# Patient Record
Sex: Female | Born: 1975 | Race: White | Hispanic: No | Marital: Married | Smoking: Former smoker
Health system: Southern US, Community
[De-identification: ages and names within clinical notes are randomized; demographics above are authoritative.]

## PROBLEM LIST (undated history)

## (undated) DIAGNOSIS — N390 Urinary tract infection, site not specified: Secondary | ICD-10-CM

## (undated) DIAGNOSIS — B019 Varicella without complication: Secondary | ICD-10-CM

## (undated) DIAGNOSIS — E162 Hypoglycemia, unspecified: Secondary | ICD-10-CM

## (undated) DIAGNOSIS — R519 Headache, unspecified: Secondary | ICD-10-CM

## (undated) DIAGNOSIS — R51 Headache: Secondary | ICD-10-CM

## (undated) HISTORY — DX: Varicella without complication: B01.9

## (undated) HISTORY — DX: Headache, unspecified: R51.9

## (undated) HISTORY — DX: Urinary tract infection, site not specified: N39.0

## (undated) HISTORY — DX: Hypoglycemia, unspecified: E16.2

## (undated) HISTORY — DX: Headache: R51

---
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
## 2009-09-11 ENCOUNTER — Other Ambulatory Visit: Admission: RE | Admit: 2009-09-11 | Discharge: 2009-09-11 | Payer: Self-pay | Admitting: Obstetrics and Gynecology

## 2010-11-24 ENCOUNTER — Inpatient Hospital Stay (HOSPITAL_COMMUNITY)
Admission: AD | Admit: 2010-11-24 | Discharge: 2010-11-26 | DRG: 373 | Disposition: A | Discharge status: Home / Self Care | Payer: BC Managed Care – PPO | Attending: Obstetrics and Gynecology | Admitting: Obstetrics and Gynecology

## 2010-11-24 LAB — CBC
Hemoglobin: 13.2 g/dL (ref 12.0–15.0)
Platelets: 144 10*3/uL — ABNORMAL LOW (ref 150–400)
RBC: 4.03 MIL/uL (ref 3.87–5.11)
WBC: 19.8 10*3/uL — ABNORMAL HIGH (ref 4.0–10.5)

## 2010-11-24 LAB — RPR: RPR Ser Ql: NONREACTIVE

## 2010-11-25 LAB — CBC
HCT: 28.6 % — ABNORMAL LOW (ref 36.0–46.0)
Hemoglobin: 9.8 g/dL — ABNORMAL LOW (ref 12.0–15.0)
MCHC: 34.3 g/dL (ref 30.0–36.0)
RBC: 3.01 MIL/uL — ABNORMAL LOW (ref 3.87–5.11)
WBC: 17.8 10*3/uL — ABNORMAL HIGH (ref 4.0–10.5)

## 2011-03-03 ENCOUNTER — Other Ambulatory Visit (HOSPITAL_COMMUNITY)
Admission: RE | Admit: 2011-03-03 | Discharge: 2011-03-03 | Disposition: A | Discharge status: Home / Self Care | Payer: BC Managed Care – PPO | Source: Ambulatory Visit | Attending: Obstetrics and Gynecology | Admitting: Obstetrics and Gynecology

## 2011-03-03 ENCOUNTER — Other Ambulatory Visit: Payer: Self-pay | Admitting: Obstetrics and Gynecology

## 2011-03-03 DIAGNOSIS — Z01419 Encounter for gynecological examination (general) (routine) without abnormal findings: Secondary | ICD-10-CM | POA: Insufficient documentation

## 2011-10-20 ENCOUNTER — Ambulatory Visit (INDEPENDENT_AMBULATORY_CARE_PROVIDER_SITE_OTHER): Payer: 59 | Admitting: Internal Medicine

## 2011-10-20 ENCOUNTER — Encounter: Payer: Self-pay | Admitting: *Deleted

## 2011-10-20 ENCOUNTER — Encounter: Payer: Self-pay | Admitting: Internal Medicine

## 2011-10-20 ENCOUNTER — Telehealth: Payer: Self-pay | Admitting: *Deleted

## 2011-10-20 VITALS — BP 108/62 | HR 78 | Temp 97.8°F | Resp 16 | Ht 67.0 in | Wt 114.0 lb

## 2011-10-20 DIAGNOSIS — Z0000 Encounter for general adult medical examination without abnormal findings: Secondary | ICD-10-CM

## 2011-10-20 DIAGNOSIS — H6980 Other specified disorders of Eustachian tube, unspecified ear: Secondary | ICD-10-CM

## 2011-10-20 NOTE — Patient Instructions (Signed)
Dampened hearing - Eustachian tube dysfunction related to your cold. Plan - sudafed (generic) 30 mg two or three times a day.  Health maintenance - will get old labs. See the doctor every 2 years 364 days. We will always be available for acute needs.  Thanks for coming to see me.  Barotitis Media Barotitis media is soreness (inflammation) of the area behind the eardrum (middle ear). This occurs when the auditory tube (Eustachian tube) leading from the back of the throat to the eardrum is blocked. When it is blocked air cannot move in and out of the middle ear to equalize pressure changes. These pressure changes come from changes in altitude when:  Flying.   Driving in the mountains.   Diving.  Problems are more likely to occur with pressure changes during times when you are congested as from:  Hay fever.   Upper respiratory infection.   A cold.  Damage or hearing loss (barotrauma) caused by this may be permanent. HOME CARE INSTRUCTIONS    Use medicines as recommended by your caregiver. Over the counter medicines will help unblock the canal and can help during times of air travel.   Do not put anything into your ears to clean or unplug them. Eardrops will not be helpful.   Do not swim, dive, or fly until your caregiver says it is all right to do so. If these activities are necessary, chewing gum with frequent swallowing may help. It is also helpful to hold your nose and gently blow to pop your ears for equalizing pressure changes. This forces air into the Eustachian tube.   For little ones with problems, give your baby a bottle of water or juice during periods when pressure changes would be anticipated such as during take offs and landings associated with air travel.   Only take over-the-counter or prescription medicines for pain, discomfort, or fever as directed by your caregiver.   A decongestant may be helpful in de-congesting the middle ear and make pressure equalization easier.  This can be even more effective if the drops (spray) are delivered with the head lying over the edge of a bed with the head tilted toward the ear on the affected side.   If your caregiver has given you a follow-up appointment, it is very important to keep that appointment. Not keeping the appointment could result in a chronic or permanent injury, pain, hearing loss and disability. If there is any problem keeping the appointment, you must call back to this facility for assistance.  SEEK IMMEDIATE MEDICAL CARE IF:    You develop a severe headache, dizziness, severe ear pain, or bloody or pus-like drainage from your ears.   An oral temperature above 102 F (38.9 C) develops.   Your problems do not improve or become worse.  MAKE SURE YOU:    Understand these instructions.   Will watch your condition.   Will get help right away if you are not doing well or get worse.  Document Released: 05/30/2000 Document Revised: 05/22/2011 Document Reviewed: 01/06/2008 [facility] Patient Information 2012 [place], [place].

## 2011-10-20 NOTE — Progress Notes (Signed)
Subjective:    Patient ID: Christine Lawrence, female    DOB: Mar 26, 1976, 36 y.o.   MRN: [id number]  HPI [name] presents to establish for on-going continuity care. She has a minor problem with residual sinus congestion and itchy ear from head cold, otherwise doing well. She is current with her gynecologist/obstetrician.  Past Medical History  Diagnosis Date  . Varicella 1982  . Generalized headaches     no recent headaches  . HIV infection 2008    routine for gyn   . Migraine     no recent migraines.  Marland Kitchen UTI (urinary tract infection)    History reviewed. No pertinent past surgical history. Family History  Problem Relation Age of Onset  . Hyperlipidemia Father   . Hypertension Father   . Heart disease Maternal Grandmother   . Diabetes Maternal Grandmother   . Arthritis Paternal Grandfather   . Cancer Paternal Grandfather     prostate  . Stroke Maternal Grandfather    History   Social History  . Marital Status: Married    Spouse Name: N/A    Number of Children: 1  . Years of Education: 16   Occupational History  . mixologist     [employer]   Social History Main Topics  . Smoking status: Former Smoker -- 0.5 packs/day for 10 years    Types: Cigarettes    Quit date: 04/04/2009  . Smokeless tobacco: Never Used  . Alcohol Use: Yes     occasional wine  . Drug Use: No  . Sexually Active: Yes -- Female partner(s)   Other Topics Concern  . Not on file   Social History Narrative   HSG, [facility] - [place] - [place]. Married '11. I dtr - '12. Work - Merchandiser, retail. Life is good.         Review of Systems Constitutional:  Negative for fever, chills, activity change and unexpected weight change.  HEENT:  Negative for hearing loss, ear pain, congestion, neck stiffness and postnasal drip. Negative for sore throat or swallowing problems. Negative for dental complaints.   Eyes: Negative for vision loss or change in visual acuity.  Respiratory:  Negative for chest tightness and wheezing. Negative for DOE.   Cardiovascular: Negative for chest pain or palpitations. No decreased exercise tolerance Gastrointestinal: No change in bowel habit. No bloating or gas. No reflux or indigestion Genitourinary: Negative for urgency, frequency, flank pain and difficulty urinating.  Musculoskeletal: Negative for myalgias, back pain, arthralgias and gait problem.  Neurological: Negative for dizziness, tremors, weakness and headaches.  Hematological: Negative for adenopathy.  Psychiatric/Behavioral: Negative for behavioral problems and dysphoric mood.       Objective:   Physical Exam Filed Vitals:   10/20/11 1036  BP: 108/62  Pulse: 78  Temp: 97.8 F (36.6 C)  Resp: 16   Weight: 114 lb (51.71 kg)  BMI 17.85 (nl 19-25)  Gen'l: thin white woman in no distress HEENT- EACs clear, no abrasion or irritation noted; TM's normal; oropharynx w/o lesions; posterior pharynx normal; teeth in good repair; no buccal lesions Neck - supple, no thyromegaly Nodes - negative submandibular, cervical, supraclavicular region Chest - w/o deformity, no CVAT Pulm -  Normal respirations, good breath sounds Cor- 2+ radial pulses, 2+ DP pulses. Regular rate and rhythm w/o murmurs Breast exam- deferred Abdomen - scaphoid, no organosplenomegaly, no guarding or rebound, no masses Pelvic - deferred to gyn Ext - no deformity Neuro - A&O x 3 CN II-XII grossly normal, DTR's normal,  MS normal, Cerebellar - no tremor, normal gait and station Derm - minor acne, small 4 mm cutaneous nodule right neck.       Assessment & Plan:  Eustachian Tube dysfunction - recent cold with residual change in hearing.  Plan - OTC sudafed 30 mg two or three times a day.

## 2011-10-20 NOTE — Assessment & Plan Note (Signed)
Very clear past medical history - she has enjoyed good health. Physical exam, sans breast and pelvic, is normal. She reports she has had lab work done in the last 2-3 years. She will obtain copies of her lab results to be reviewed before ordering additional studies. She immigrated in the last five years and had all her required immunizations at that time. She will obtain records of her immunizations if possible.  In summary - a nice young woman who appears to be healthy and medically stable except that she is a bit underweight with a low BMI. She will follow with her Ob/Gyn as scheduled. She should have a general physical exam in 3 years. She is oriented to the services and processes of the practice so that she can be seen on an as needed basis.

## 2011-10-29 NOTE — Telephone Encounter (Signed)
error 

## 2011-10-30 ENCOUNTER — Telehealth: Payer: Self-pay | Admitting: Internal Medicine

## 2011-10-30 NOTE — Telephone Encounter (Signed)
Received copies from [facility] ,on 10/30/11. Forwarded 7 pages to Dr.Norins,for review .

## 2012-03-19 ENCOUNTER — Other Ambulatory Visit (HOSPITAL_COMMUNITY)
Admission: RE | Admit: 2012-03-19 | Discharge: 2012-03-19 | Disposition: A | Discharge status: Home / Self Care | Payer: 59 | Source: Ambulatory Visit | Attending: Obstetrics and Gynecology | Admitting: Obstetrics and Gynecology

## 2012-03-19 ENCOUNTER — Other Ambulatory Visit: Payer: Self-pay | Admitting: Obstetrics and Gynecology

## 2012-03-19 DIAGNOSIS — N760 Acute vaginitis: Secondary | ICD-10-CM | POA: Insufficient documentation

## 2012-03-19 DIAGNOSIS — Z01419 Encounter for gynecological examination (general) (routine) without abnormal findings: Secondary | ICD-10-CM | POA: Insufficient documentation

## 2013-03-21 ENCOUNTER — Other Ambulatory Visit (HOSPITAL_COMMUNITY)
Admission: RE | Admit: 2013-03-21 | Discharge: 2013-03-21 | Disposition: A | Discharge status: Home / Self Care | Payer: 59 | Source: Ambulatory Visit | Attending: Obstetrics and Gynecology | Admitting: Obstetrics and Gynecology

## 2013-03-21 ENCOUNTER — Other Ambulatory Visit: Payer: Self-pay | Admitting: Obstetrics and Gynecology

## 2013-03-21 DIAGNOSIS — Z01419 Encounter for gynecological examination (general) (routine) without abnormal findings: Secondary | ICD-10-CM | POA: Insufficient documentation

## 2013-03-21 DIAGNOSIS — Z1151 Encounter for screening for human papillomavirus (HPV): Secondary | ICD-10-CM | POA: Insufficient documentation

## 2013-10-05 ENCOUNTER — Ambulatory Visit (INDEPENDENT_AMBULATORY_CARE_PROVIDER_SITE_OTHER): Payer: 59 | Admitting: Internal Medicine

## 2013-10-05 ENCOUNTER — Encounter: Payer: Self-pay | Admitting: Internal Medicine

## 2013-10-05 ENCOUNTER — Other Ambulatory Visit (INDEPENDENT_AMBULATORY_CARE_PROVIDER_SITE_OTHER): Payer: 59

## 2013-10-05 VITALS — BP 92/68 | HR 79 | Temp 98.0°F | Wt 118.1 lb

## 2013-10-05 DIAGNOSIS — K625 Hemorrhage of anus and rectum: Secondary | ICD-10-CM

## 2013-10-05 LAB — CBC WITH DIFFERENTIAL/PLATELET
BASOS PCT: 1.3 % (ref 0.0–3.0)
Basophils Absolute: 0.1 10*3/uL (ref 0.0–0.1)
EOS ABS: 0.2 10*3/uL (ref 0.0–0.7)
Eosinophils Relative: 3.8 % (ref 0.0–5.0)
HCT: 39.3 % (ref 36.0–46.0)
Hemoglobin: 13.3 g/dL (ref 12.0–15.0)
LYMPHS PCT: 32 % (ref 12.0–46.0)
Lymphs Abs: 1.9 10*3/uL (ref 0.7–4.0)
MCHC: 33.9 g/dL (ref 30.0–36.0)
MCV: 92.6 fl (ref 78.0–100.0)
Monocytes Absolute: 0.3 10*3/uL (ref 0.1–1.0)
Monocytes Relative: 5.1 % (ref 3.0–12.0)
NEUTROS PCT: 57.8 % (ref 43.0–77.0)
Neutro Abs: 3.4 10*3/uL (ref 1.4–7.7)
PLATELETS: 196 10*3/uL (ref 150.0–400.0)
RBC: 4.25 Mil/uL (ref 3.87–5.11)
RDW: 12.5 % (ref 11.5–14.6)
WBC: 5.8 10*3/uL (ref 4.5–10.5)

## 2013-10-05 LAB — HEPATIC FUNCTION PANEL
ALK PHOS: 33 U/L — AB (ref 39–117)
ALT: 16 U/L (ref 0–35)
AST: 18 U/L (ref 0–37)
Albumin: 4.3 g/dL (ref 3.5–5.2)
BILIRUBIN DIRECT: 0.1 mg/dL (ref 0.0–0.3)
BILIRUBIN TOTAL: 0.9 mg/dL (ref 0.3–1.2)
TOTAL PROTEIN: 7.4 g/dL (ref 6.0–8.3)

## 2013-10-05 LAB — BASIC METABOLIC PANEL
BUN: 12 mg/dL (ref 6–23)
CALCIUM: 9.3 mg/dL (ref 8.4–10.5)
CO2: 28 mEq/L (ref 19–32)
CREATININE: 0.6 mg/dL (ref 0.4–1.2)
Chloride: 106 mEq/L (ref 96–112)
GFR: 129.92 mL/min (ref >60.00)
Glucose, Bld: 85 mg/dL (ref 70–99)
Potassium: 4.9 mEq/L (ref 3.5–5.1)
Sodium: 141 mEq/L (ref 135–145)

## 2013-10-05 LAB — LIPID PANEL
CHOL/HDL RATIO: 2
Cholesterol: 200 mg/dL (ref 0–200)
HDL: 81.7 mg/dL (ref >39.00)
LDL CALC: 111 mg/dL — AB (ref 0–99)
TRIGLYCERIDES: 38 mg/dL (ref 0.0–149.0)
VLDL: 7.6 mg/dL (ref 0.0–40.0)

## 2013-10-05 NOTE — Progress Notes (Signed)
Pre visit review using our clinic review tool, if applicable. No additional management support is needed unless otherwise documented below in the visit note. 

## 2013-10-05 NOTE — Progress Notes (Signed)
   Subjective:    Patient ID: Christine Lawrence, female    DOB: 07/12/1975, 38 y.o.   MRN: [id number]  HPI  Patient is here  Episode of rectal bleeding Onset 1 week ago, occur daily with bowel movements Describes BRB at terminal of bowel movement, not mixed with stool or clotted. Denies cramping, rectal pain, abdominal pain, fever, nausea or vomiting. History of prior rectal bleeding in setting of hemorrhoids, but none since No GI history or family history of colitis  Also reviewed chronic medical issues and interval medical events  Past Medical History  Diagnosis Date  . Varicella 1982  . Generalized headaches     no recent headaches  . Migraine     no recent migraines.  Marland Kitchen. UTI (urinary tract infection)     Review of Systems     Objective:   Physical Exam  BP 92/68  Pulse 79  Temp(Src) 98 F (36.7 C) (Oral)  Wt 118 lb 1.9 oz (53.579 kg)  SpO2 98% Wt Readings from Last 3 Encounters:  10/05/13 118 lb 1.9 oz (53.579 kg)  10/20/11 114 lb (51.71 kg)   Constitutional: She is thin and fit, appears well-developed and well-nourished. No distress.  Neck: Normal range of motion. Neck supple. No JVD present. No thyromegaly present.  Cardiovascular: Normal rate, regular rhythm and normal heart sounds.  No murmur heard. No BLE edema. Pulmonary/Chest: Effort normal and breath sounds normal. No respiratory distress. She has no wheezes.  Abdomen: soft, nontender, nondistended. Bowel sounds present throughout. No mass, no rebound or guarding Rectal: (exam performed by/supervised by Florentina AddisonKatie fields PA-s) normal appearing external, digital exam without tenderness. No appreciable fissure, mass. No stool in vault but blood-tinged mucus, guaiac positive Psychiatric: She has a normal mood and affect. Her behavior is normal. Judgment and thought content normal.   Lab Results  Component Value Date   WBC 17.8* 11/25/2010   HGB 9.8* 11/25/2010   HCT 28.6* 11/25/2010   PLT 128* 11/25/2010    No  results found.     Assessment & Plan:   Episode of hematochezia, painless -resolving past 48 hours but daily episode for approximately one week Blood separate from stool at end of BM, no melena Prior history of hemorrhoids, but not appreciated on exam Check labs today Refer to GI - consider colonoscopy for evaluation of possible inflammatory bowel disease, versus observation. History with low suspicion for infection No medication changes recommended pending clarification of diagnosis Patient education provided

## 2013-10-05 NOTE — Patient Instructions (Addendum)
It was good to see you today.  We have reviewed your prior records including labs and tests today  Test(s) ordered today. Your results will be released to MyChart (or called to you) after review, usually within 72hours after test completion. If any changes need to be made, you will be notified at that same time.  Medications reviewed and updated, no changes recommended at this time.  we'll make referral to gastroenterology for additional evaluation of your bleeding episode. Our office will contact you regarding appointment(s) once made.   Rectal Bleeding Rectal bleeding is when blood passes out of the anus. It is usually a sign that something is wrong. It may not be serious, but it should always be evaluated. Rectal bleeding may present as bright red blood or extremely dark stools. The color may range from dark red or maroon to black (like tar). It is important that the cause of rectal bleeding be identified so treatment can be started and the problem corrected. CAUSES   Hemorrhoids. These are enlarged (dilated) blood vessels or veins in the anal or rectal area.  Fistulas. Theseare abnormal, burrowing channels that usually run from inside the rectum to the skin around the anus. They can bleed.  Anal fissures. This is a tear in the tissue of the anus. Bleeding occurs with bowel movements.  Diverticulosis. This is a condition in which pockets or sacs project from the bowel wall. Occasionally, the sacs can bleed.  Diverticulitis. Thisis an infection involving diverticulosis of the colon.  Proctitis and colitis. These are conditions in which the rectum, colon, or both, can become inflamed and pitted (ulcerated).  Polyps and cancer. Polyps are non-cancerous (benign) growths in the colon that may bleed. Certain types of polyps turn into cancer.  Protrusion of the rectum. Part of the rectum can project from the anus and bleed.  Certain medicines.  Intestinal infections.  Blood vessel  abnormalities. HOME CARE INSTRUCTIONS  Eat a high-fiber diet to keep your stool soft.  Limit activity.  Drink enough fluids to keep your urine clear or pale yellow.  Warm baths may be useful to soothe rectal pain.  Follow up with your caregiver as directed. SEEK IMMEDIATE MEDICAL CARE IF:  You develop increased bleeding.  You have black or dark red stools.  You vomit blood or material that looks like coffee grounds.  You have abdominal pain or tenderness.  You have a fever.  You feel weak, nauseous, or you faint.  You have severe rectal pain or you are unable to have a bowel movement. MAKE SURE YOU:  Understand these instructions.  Will watch your condition.  Will get help right away if you are not doing well or get worse. Document Released: 11/22/2001 Document Revised: 08/25/2011 Document Reviewed: 11/17/2010 [facility] Patient Information 2014 [place], [place].

## 2013-10-05 NOTE — Progress Notes (Signed)
Christine HueMartina F Lawrence [id number]/10/1975 10/05/2013  Chief Complaint  Patient presents with  . Rectal Bleeding    Pt states on last wednesday notice blood in her stool. Haven't seen any since. denies pain or cramping    Subjective  HPI  Patient presents with bright red painless blood at the end of her stool  1 week ago. Sxs occurred daily for five days, and have resolved. When she went to the bathroom last Wednesday, she noticed bright red blood at the end of her stool and 4 clear drips in the toilet afterwards. Last Monday, she noticed a hard stool. Has a history of painful hemorrhoids postpartum in 2012. Patient denies bloody diarrhea, eating anything unusual, going camping, swimming in water parks, eating raw foods, recent travel. Eats a healthy diet (salads, occasional pizza, asian and spicy foods). LMP was a week ago. Denies N/V, constipation, trauma, abdominal pain, body aches, chills, myalgias, fever. Denies history of diverticulosis, diverticulitis, chron's disease, colorectal cancer, anemia, GI bleeding, or PUD.    Past Medical History  Diagnosis Date  . Varicella 1982  . Generalized headaches     no recent headaches  . Migraine     no recent migraines.  Marland Kitchen. UTI (urinary tract infection)     No past surgical history on file.  Family History  Problem Relation Age of Onset  . Hyperlipidemia Father   . Hypertension Father   . Heart disease Maternal Grandmother   . Diabetes Maternal Grandmother   . Arthritis Paternal Grandfather   . Cancer Paternal Grandfather     prostate  . Stroke Maternal Grandfather     History  Substance Use Topics  . Smoking status: Former Smoker -- 0.50 packs/day for 10 years    Types: Cigarettes    Quit date: 04/04/2009  . Smokeless tobacco: Never Used  . Alcohol Use: Yes     Comment: occasional wine    No current outpatient prescriptions on file prior to visit.   No current facility-administered medications on file prior to visit.     Allergies:No  Known Allergies  Review of Systems  Constitutional: Negative for fever, chills, weight loss and malaise/fatigue.  Respiratory: Negative for shortness of breath.   Cardiovascular: Negative for chest pain.  Gastrointestinal: Positive for blood in stool. Negative for nausea, vomiting, abdominal pain, diarrhea, constipation and melena.       Denies hematemesis and bloody diarrhea.  Genitourinary:       No urinary changes.  Musculoskeletal: Negative for myalgias.  Neurological: Negative for weakness.       Objective  Filed Vitals:   10/05/13 1102  BP: 92/68  Pulse: 79  Temp: 98 F (36.7 C)  TempSrc: Oral  Weight: 118 lb 1.9 oz (53.579 kg)  SpO2: 98%    Physical Exam  Constitutional: She appears well-developed and well-nourished. No distress.  Cardiovascular: Normal rate, regular rhythm and normal heart sounds.  Exam reveals no gallop and no friction rub.   No murmur heard. Respiratory: Effort normal and breath sounds normal. She has no wheezes. She has no rales.  GI: Soft. Bowel sounds are normal. There is no tenderness. There is no rebound and no guarding.  Genitourinary: Guaiac positive stool.  Psychiatric: She has a normal mood and affect. Her behavior is normal. Judgment and thought content normal.  DRE: Normal rectal canal with no inflammation, nodules, or abnormalities. No fissures, hemorrhoids. DRE painless.   BP Readings from Last 3 Encounters:  10/05/13 92/68  10/20/11 108/62  Wt Readings from Last 3 Encounters:  10/05/13 118 lb 1.9 oz (53.579 kg)  10/20/11 114 lb (51.71 kg)    Lab Results  Component Value Date   WBC 17.8* 11/25/2010   HGB 9.8* 11/25/2010   HCT 28.6* 11/25/2010   PLT 128* 11/25/2010    No results found.     Assessment and Plan  Hematochezia, reports resolved but w/ Guiac Positive Stool: Refer to GI for consideration of colonosocopy since Guaiac positive stool. Possible Chron's Disease or other IBD given age or possible internal  hemorrhoids that were not palpated on DRE. No treatment at this time due to unknown etiology.    Labs ordered: CBC to r/o infectious process, Hepatic function panel, Lipid panel, and BMP.   Please call if any new sxs or problems arise.   Return if symptoms worsen or fail to improve. Larwance RoteKatie G Sayge Salvato, Student-PA   I have personally reviewed this case with PA student. I also personally examined this patient. I agree with history and findings as documented above. I reviewed, discussed and approve of the assessment and plan as listed above. Newt LukesValerie A Leschber, MD

## 2013-11-08 ENCOUNTER — Encounter: Payer: Self-pay | Admitting: Internal Medicine

## 2014-03-21 ENCOUNTER — Other Ambulatory Visit: Payer: Self-pay | Admitting: Obstetrics and Gynecology

## 2014-03-21 ENCOUNTER — Other Ambulatory Visit (HOSPITAL_COMMUNITY)
Admission: RE | Admit: 2014-03-21 | Discharge: 2014-03-21 | Disposition: A | Discharge status: Home / Self Care | Payer: 59 | Source: Ambulatory Visit | Attending: Obstetrics and Gynecology | Admitting: Obstetrics and Gynecology

## 2014-03-21 DIAGNOSIS — Z01419 Encounter for gynecological examination (general) (routine) without abnormal findings: Secondary | ICD-10-CM | POA: Insufficient documentation

## 2014-03-23 LAB — CYTOLOGY - PAP

## 2015-02-12 ENCOUNTER — Other Ambulatory Visit: Payer: Self-pay

## 2015-02-12 DIAGNOSIS — Z1231 Encounter for screening mammogram for malignant neoplasm of breast: Secondary | ICD-10-CM

## 2015-02-16 ENCOUNTER — Ambulatory Visit
Admission: RE | Admit: 2015-02-16 | Discharge: 2015-02-16 | Disposition: A | Discharge status: Home / Self Care | Payer: 59 | Source: Ambulatory Visit

## 2015-02-16 DIAGNOSIS — Z1231 Encounter for screening mammogram for malignant neoplasm of breast: Secondary | ICD-10-CM

## 2015-02-21 ENCOUNTER — Other Ambulatory Visit: Payer: Self-pay | Admitting: Obstetrics and Gynecology

## 2015-02-21 ENCOUNTER — Other Ambulatory Visit: Payer: Self-pay

## 2015-02-21 DIAGNOSIS — N644 Mastodynia: Secondary | ICD-10-CM

## 2015-03-01 ENCOUNTER — Ambulatory Visit
Admission: RE | Admit: 2015-03-01 | Discharge: 2015-03-01 | Disposition: A | Discharge status: Home / Self Care | Payer: 59 | Source: Ambulatory Visit | Attending: Obstetrics and Gynecology | Admitting: Obstetrics and Gynecology

## 2015-03-01 DIAGNOSIS — N644 Mastodynia: Secondary | ICD-10-CM

## 2015-04-05 ENCOUNTER — Encounter: Payer: Self-pay | Admitting: Internal Medicine

## 2015-04-05 ENCOUNTER — Ambulatory Visit (INDEPENDENT_AMBULATORY_CARE_PROVIDER_SITE_OTHER): Payer: 59 | Admitting: Internal Medicine

## 2015-04-05 VITALS — BP 98/70 | HR 60 | Temp 98.0°F | Resp 16 | Wt 119.0 lb

## 2015-04-05 DIAGNOSIS — J302 Other seasonal allergic rhinitis: Secondary | ICD-10-CM

## 2015-04-05 DIAGNOSIS — J309 Allergic rhinitis, unspecified: Secondary | ICD-10-CM | POA: Insufficient documentation

## 2015-04-05 NOTE — Patient Instructions (Signed)
Allergic Rhinitis Allergic rhinitis is when the mucous membranes in the nose respond to allergens. Allergens are particles in the air that cause your body to have an allergic reaction. This causes you to release allergic antibodies. Through a chain of events, these eventually cause you to release histamine into the blood stream. Although meant to protect the body, it is this release of histamine that causes your discomfort, such as frequent sneezing, congestion, and an itchy, runny nose.  CAUSES Seasonal allergic rhinitis (hay fever) is caused by pollen allergens that may come from grasses, trees, and weeds. Year-round allergic rhinitis (perennial allergic rhinitis) is caused by allergens such as house dust mites, pet dander, and mold spores. SYMPTOMS  Nasal stuffiness (congestion).  Itchy, runny nose with sneezing and tearing of the eyes. DIAGNOSIS Your health care provider can help you determine the allergen or allergens that trigger your symptoms. If you and your health care provider are unable to determine the allergen, skin or blood testing may be used. Your health care provider will diagnose your condition after taking your health history and performing a physical exam. Your health care provider may assess you for other related conditions, such as asthma, pink eye, or an ear infection. TREATMENT Allergic rhinitis does not have a cure, but it can be controlled by:  Medicines that block allergy symptoms. These may include allergy shots, nasal sprays, and oral antihistamines.  Avoiding the allergen. Hay fever may often be treated with antihistamines in pill or nasal spray forms. Antihistamines block the effects of histamine. There are over-the-counter medicines that may help with nasal congestion and swelling around the eyes. Check with your health care provider before taking or giving this medicine. If avoiding the allergen or the medicine prescribed do not work, there are many new medicines  your health care provider can prescribe. Stronger medicine may be used if initial measures are ineffective. Desensitizing injections can be used if medicine and avoidance does not work. Desensitization is when a patient is given ongoing shots until the body becomes less sensitive to the allergen. Make sure you follow up with your health care provider if problems continue. HOME CARE INSTRUCTIONS It is not possible to completely avoid allergens, but you can reduce your symptoms by taking steps to limit your exposure to them. It helps to know exactly what you are allergic to so that you can avoid your specific triggers. SEEK MEDICAL CARE IF:  You have a fever.  You develop a cough that does not stop easily (persistent).  You have shortness of breath.  You start wheezing.  Symptoms interfere with normal daily activities.   This information is not intended to replace advice given to you by your health care provider. Make sure you discuss any questions you have with your health care provider.   Document Released: 02/25/2001 Document Revised: 06/23/2014 Document Reviewed: 02/07/2013 Elsevier Interactive Patient Education 2016 [employer]  

## 2015-04-05 NOTE — Assessment & Plan Note (Signed)
Her symptoms are consistent with seasonal allergies Allegra or  nasal spray such as Flonase daily in the spring and fall which is when she is symptomatic Explained that her ear discomfort is likely related to allergies since it did improve with taking Allegra daily Deferred audiology examination. Her decreased hearing is likely a result of cold and it did occur with cold Symptomatic treatment  Call with any questions or concerns

## 2015-04-05 NOTE — Progress Notes (Signed)
Pre visit review using our clinic review tool, if applicable. No additional management support is needed unless otherwise documented below in the visit note. 

## 2015-04-05 NOTE — Progress Notes (Signed)
Subjective:    Patient ID: Christine Lawrence, female    DOB: December 24, 1975, 39 y.o.   MRN: [id number]  HPI  She is here to establish and for ear pain.   Her ear pain is more of a discomfort.  Two years ago had a bad head cold with lot of congestion.  When that went away the left ear started to bother her.  Her hearing was muffled and the canal started to itch.  It gets worse with changes in seasons and allergy symtpoms.  She wakes up in the middle of the night itching her ear canal.  Her hearing is decreased on the left. She never knew she had allergies and has never been officially diagnosed, but does have allergy-like symptoms. Last year she took Allegra and it helped. She would prefer not to take any medication and wanted to have it evaluated before restarting the Allegra.    Medications and allergies reviewed with patient and updated if appropriate.  Patient Active Problem List   Diagnosis Date Noted  . Routine health maintenance 10/20/2011    Past Medical History  Diagnosis Date  . Varicella 1982  . Generalized headaches     no recent headaches  . Migraine     no recent migraines.  Marland Kitchen UTI (urinary tract infection)     No past surgical history on file.  Social History   Social History  . Marital Status: Married    Spouse Name: N/A  . Number of Children: 1  . Years of Education: 16   Occupational History  . mixologist     [employer]   Social History Main Topics  . Smoking status: Former Smoker -- 0.50 packs/day for 10 years    Types: Cigarettes    Quit date: 04/04/2009  . Smokeless tobacco: Never Used  . Alcohol Use: Yes     Comment: occasional wine  . Drug Use: No  . Sexual Activity:    Partners: Male   Other Topics Concern  . Not on file   Social History Narrative   [facility], [facility] - [place] - [place]. Married '11. I dtr - '12. Work - Merchandiser, retail. Life is good.     Review of Systems  Constitutional: Negative for fever and chills.    HENT: Positive for congestion (worse in am) and hearing loss. Negative for ear pain, sinus pressure, sneezing and sore throat.   Eyes: Positive for itching. Negative for pain, discharge and redness.  Respiratory: Negative for cough, shortness of breath and wheezing.   Cardiovascular: Negative for chest pain and palpitations.  Neurological: Negative for dizziness, light-headedness and headaches.       Objective:   Filed Vitals:   04/05/15 1051  BP: 98/70  Pulse: 60  Temp: 98 F (36.7 C)  Resp: 16   Filed Weights   04/05/15 1051  Weight: 119 lb (53.978 kg)   Body mass index is 18.63 kg/(m^2).   Physical Exam  Constitutional: She appears well-developed and well-nourished.  HENT:  Head: Normocephalic and atraumatic.  Right Ear: External ear normal.  Left Ear: External ear normal.  Nose: Nose normal.  Mouth/Throat: Oropharynx is clear and moist. No oropharyngeal exudate.  Eyes: Conjunctivae are normal.  Neck: Neck supple. No tracheal deviation present. No thyromegaly present.  Pea sized cyst center of neck, mobile non-tender, cyst like  Cardiovascular: Normal rate, regular rhythm and normal heart sounds.   No murmur heard. Pulmonary/Chest: Effort normal and breath sounds normal. No respiratory distress.  She has no wheezes.  Musculoskeletal: She exhibits no edema.  Lymphadenopathy:    She has no cervical adenopathy.  Skin: No rash noted.        Assessment & Plan:   See problem list  Follow-up as needed

## 2015-05-15 ENCOUNTER — Other Ambulatory Visit: Payer: Self-pay | Admitting: Obstetrics and Gynecology

## 2015-05-15 ENCOUNTER — Other Ambulatory Visit (HOSPITAL_COMMUNITY)
Admission: RE | Admit: 2015-05-15 | Discharge: 2015-05-15 | Disposition: A | Discharge status: Home / Self Care | Payer: 59 | Source: Ambulatory Visit | Attending: Obstetrics and Gynecology | Admitting: Obstetrics and Gynecology

## 2015-05-15 DIAGNOSIS — Z01419 Encounter for gynecological examination (general) (routine) without abnormal findings: Secondary | ICD-10-CM | POA: Diagnosis present

## 2015-05-17 LAB — CYTOLOGY - PAP

## 2015-08-31 ENCOUNTER — Telehealth: Payer: Self-pay

## 2015-08-31 NOTE — Telephone Encounter (Signed)
LVM for pt to call back as soon as possible.   RE: Flu Vaccine 2016-2017 

## 2016-05-15 ENCOUNTER — Other Ambulatory Visit (HOSPITAL_COMMUNITY)
Admission: RE | Admit: 2016-05-15 | Discharge: 2016-05-15 | Disposition: A | Discharge status: Home / Self Care | Payer: 59 | Source: Ambulatory Visit | Attending: Obstetrics and Gynecology | Admitting: Obstetrics and Gynecology

## 2016-05-15 ENCOUNTER — Other Ambulatory Visit: Payer: Self-pay | Admitting: Obstetrics and Gynecology

## 2016-05-15 DIAGNOSIS — Z01419 Encounter for gynecological examination (general) (routine) without abnormal findings: Secondary | ICD-10-CM | POA: Diagnosis present

## 2016-05-15 DIAGNOSIS — Z1151 Encounter for screening for human papillomavirus (HPV): Secondary | ICD-10-CM | POA: Insufficient documentation

## 2016-05-19 LAB — CYTOLOGY - PAP
Diagnosis: NEGATIVE
HPV (WINDOPATH): NOT DETECTED

## 2017-05-19 DIAGNOSIS — Z01419 Encounter for gynecological examination (general) (routine) without abnormal findings: Secondary | ICD-10-CM | POA: Diagnosis not present

## 2018-07-20 ENCOUNTER — Telehealth: Payer: Self-pay | Admitting: Internal Medicine

## 2018-07-20 NOTE — Telephone Encounter (Signed)
Ok with me 

## 2018-07-20 NOTE — Telephone Encounter (Signed)
See note, advise on transfer

## 2018-07-20 NOTE — Telephone Encounter (Signed)
Routing to [facility] office.

## 2018-07-20 NOTE — Telephone Encounter (Signed)
Copied from CRM [id number]. Topic: Appointment Scheduling - Transfer of Care >> Jul 20, 2018 12:22 PM Angela Nevin wrote: Pt is requesting to transfer FROM: [facility]-Dr. Lawerance Bach Pt is requesting to transfer TO: [facility]- Dr. Artis Flock Reason for requested transfer: Husband is a patient at [facility], would like to have same office.   Dr.Burns is this okay with you?

## 2018-07-21 NOTE — Telephone Encounter (Signed)
Fine with me

## 2018-07-21 NOTE — Telephone Encounter (Signed)
Please schedule patient for TOC appointment

## 2018-08-05 ENCOUNTER — Encounter: Payer: 59 | Admitting: Family Medicine

## 2018-08-06 ENCOUNTER — Encounter: Payer: 59 | Admitting: Family Medicine

## 2018-08-06 ENCOUNTER — Encounter: Payer: Self-pay | Admitting: Family Medicine

## 2018-08-06 ENCOUNTER — Ambulatory Visit (INDEPENDENT_AMBULATORY_CARE_PROVIDER_SITE_OTHER): Payer: 59 | Admitting: Family Medicine

## 2018-08-06 VITALS — BP 98/70 | HR 67 | Temp 98.2°F | Ht 67.0 in | Wt 116.6 lb

## 2018-08-06 DIAGNOSIS — M79645 Pain in left finger(s): Secondary | ICD-10-CM

## 2018-08-06 DIAGNOSIS — Z0000 Encounter for general adult medical examination without abnormal findings: Secondary | ICD-10-CM

## 2018-08-06 DIAGNOSIS — Z0001 Encounter for general adult medical examination with abnormal findings: Secondary | ICD-10-CM

## 2018-08-06 DIAGNOSIS — R202 Paresthesia of skin: Secondary | ICD-10-CM | POA: Diagnosis not present

## 2018-08-06 DIAGNOSIS — Z23 Encounter for immunization: Secondary | ICD-10-CM

## 2018-08-06 DIAGNOSIS — M79644 Pain in right finger(s): Secondary | ICD-10-CM | POA: Diagnosis not present

## 2018-08-06 DIAGNOSIS — L301 Dyshidrosis [pompholyx]: Secondary | ICD-10-CM | POA: Diagnosis not present

## 2018-08-06 DIAGNOSIS — Z114 Encounter for screening for human immunodeficiency virus [HIV]: Secondary | ICD-10-CM | POA: Diagnosis not present

## 2018-08-06 LAB — COMPREHENSIVE METABOLIC PANEL
ALBUMIN: 5.1 g/dL (ref 3.5–5.2)
ALT: 20 U/L (ref 0–35)
AST: 21 U/L (ref 0–37)
Alkaline Phosphatase: 51 U/L (ref 39–117)
BILIRUBIN TOTAL: 1.6 mg/dL — AB (ref 0.2–1.2)
BUN: 12 mg/dL (ref 6–23)
CO2: 29 mEq/L (ref 19–32)
CREATININE: 0.71 mg/dL (ref 0.40–1.20)
Calcium: 9.9 mg/dL (ref 8.4–10.5)
Chloride: 101 mEq/L (ref 96–112)
GFR: 90.63 mL/min (ref >60.00)
Glucose, Bld: 86 mg/dL (ref 70–99)
Potassium: 4.4 mEq/L (ref 3.5–5.1)
Sodium: 139 mEq/L (ref 135–145)
Total Protein: 7.4 g/dL (ref 6.0–8.3)

## 2018-08-06 LAB — CBC WITH DIFFERENTIAL/PLATELET
BASOS ABS: 0.1 10*3/uL (ref 0.0–0.1)
BASOS PCT: 0.9 % (ref 0.0–3.0)
EOS ABS: 0.1 10*3/uL (ref 0.0–0.7)
Eosinophils Relative: 0.6 % (ref 0.0–5.0)
HEMATOCRIT: 40 % (ref 36.0–46.0)
HEMOGLOBIN: 13.5 g/dL (ref 12.0–15.0)
Lymphocytes Relative: 24.2 % (ref 12.0–46.0)
Lymphs Abs: 1.9 10*3/uL (ref 0.7–4.0)
MCHC: 33.8 g/dL (ref 30.0–36.0)
MCV: 92.1 fl (ref 78.0–100.0)
MONOS PCT: 7.5 % (ref 3.0–12.0)
Monocytes Absolute: 0.6 10*3/uL (ref 0.1–1.0)
NEUTROS ABS: 5.3 10*3/uL (ref 1.4–7.7)
Neutrophils Relative %: 66.8 % (ref 43.0–77.0)
PLATELETS: 235 10*3/uL (ref 150.0–400.0)
RBC: 4.35 Mil/uL (ref 3.87–5.11)
RDW: 12.8 % (ref 11.5–15.5)
WBC: 8 10*3/uL (ref 4.0–10.5)

## 2018-08-06 LAB — VITAMIN B12: VITAMIN B 12: 351 pg/mL (ref 211–911)

## 2018-08-06 LAB — LIPID PANEL
CHOLESTEROL: 240 mg/dL — AB (ref 0–200)
HDL: 90.2 mg/dL (ref >39.00)
LDL Cholesterol: 137 mg/dL — ABNORMAL HIGH (ref 0–99)
NonHDL: 149.8
Total CHOL/HDL Ratio: 3
Triglycerides: 64 mg/dL (ref 0.0–149.0)
VLDL: 12.8 mg/dL (ref 0.0–40.0)

## 2018-08-06 LAB — TSH: TSH: 0.85 u[IU]/mL (ref 0.35–4.50)

## 2018-08-06 LAB — URIC ACID: URIC ACID, SERUM: 4.3 mg/dL (ref 2.4–7.0)

## 2018-08-06 MED ORDER — GABAPENTIN 300 MG PO CAPS: 300.0000 mg | ORAL_CAPSULE | Freq: Every day | ORAL | 3 refills | Status: DC

## 2018-08-06 NOTE — Progress Notes (Signed)
Patient: Christine Lawrence MRN: [id number] DOB: [birth year] PCP: Orland Mustard, MD     Subjective:  Chief Complaint  Patient presents with  . Transitions Of Care  . issues w/hands  . numbness/tingling in arms and legs    HPI: The patient is a 43 y.o. female who presents today for transition of care.   She is here for an annual and to discuss some other issues. She has not been in hospital over the past year. She is healthy and fit. She has one daughter. She is fasting today.   No FH of breast or colon cancer. Paternal grandfather with colon cancer and maternal grandfather with MI.   Pap: 04/2016 Mmg: 2017-normal Tdap: longer than 10 years Hiv: done at home   She has complaints of tingling in her arms and legs. She feels shooting pain and tingling from the knee down it's in the back of her knee and down. She notices it more after a hard day of work. It helps to squeeze her calves. She has the same feeling on her forearms. Deep massages helps. Pickle juice sometimes helps. She doesn't think it's a muscle cramp and it's not numb. She has to move it to make it feel better. She states it has gone on for years. Her father has the same thing. She has no history of neck or back trauma. Her symptoms are 90% at night when she is laying down. It is waves of tingling/shooting through her arms/legs. If she stands up it will go away for a moment. Ibuprofen does help. Does not happen every night, but at least once/week.   Issues with her hands: about 3-4 years ago she started to get bumps on her knuckles. They put her on a bunch of creams and nothing really worked. She thinks it worse with soy food. It looks like a tiny blister and it itches. She scratches it and it spreads. She has seen a dermatologist and they have her the steroid cream.   She also feels like her joints are going wacky. She states for 6 months she can see tiny bubbles that start at the base of her MCP and travel down her finger and  into her joint then they rupture and hurt so bad. 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 She states that you can see the bubbles and they are blue/bruised looking when they pop. Otherwise they are white or clear in color.   Review of Systems  Constitutional: Positive for fatigue.  Eyes: Negative for visual disturbance.  Respiratory: Negative for shortness of breath.   Cardiovascular: Negative for chest pain.  Gastrointestinal: Negative for abdominal pain and nausea.  Musculoskeletal: Positive for back pain and neck pain.  Skin: Positive for rash.  Neurological: Positive for dizziness, tremors and numbness. Negative for weakness and headaches.       Pins and needles sensation in forearms and legs at night time  Psychiatric/Behavioral: Negative for sleep disturbance. The patient is not nervous/anxious.     Allergies Patient has No Known Allergies.  Past Medical History Patient  has a past medical history of Generalized headaches, Migraine, UTI (urinary tract infection), and Varicella (1982).  Surgical History Patient  has no past surgical history on file.  Family History Pateint's family history includes Arthritis in her paternal grandfather; Cancer in her paternal grandfather; Diabetes in her maternal grandmother; Heart disease in her maternal grandmother; Hyperlipidemia in her father; Hypertension in her father; Stroke in her maternal grandfather.  Social History Patient  reports that she quit smoking about 9 years ago. Her smoking use included cigarettes. She has a 5.00 pack-year smoking history. She has never used smokeless tobacco. She reports current alcohol use. She reports that she does not use drugs.    Objective: Vitals:   08/06/18 1047  BP: 98/70  Pulse: 67  Temp: 98.2 F (36.8 C)  TempSrc: Oral  SpO2: 98%  Weight: 116 lb 9.6 oz (52.9 kg)  Height: 5\' 7"  (1.702 m)    Body mass index is 18.26 kg/m.  Physical Exam Vitals signs reviewed.   Constitutional:      Appearance: Normal appearance. She is normal weight.  HENT:     Head: Normocephalic and atraumatic.     Right Ear: Tympanic membrane, ear canal and external ear normal.     Left Ear: Tympanic membrane, ear canal and external ear normal.     Nose: Nose normal.  Neck:     Musculoskeletal: Normal range of motion and neck supple.  Cardiovascular:     Rate and Rhythm: Normal rate and regular rhythm.     Heart sounds: Normal heart sounds.  Pulmonary:     Effort: Pulmonary effort is normal.     Breath sounds: Normal breath sounds.  Abdominal:     General: Abdomen is flat. Bowel sounds are normal.     Palpations: Abdomen is soft.  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     Findings: Rash (mild dyshidrotic eczema on bilateral hands) present.  Neurological:     General: No focal deficit present.     Mental Status: She is alert and oriented to person, place, and time.     Cranial Nerves: No cranial nerve deficit.     Sensory: No sensory deficit.     Motor: No weakness.     Coordination: Coordination normal.     Gait: Gait normal.     Deep Tendon Reflexes: Reflexes normal.  Psychiatric:        Mood and Affect: Mood normal.        Behavior: Behavior normal.    Depression screen [facility] 2/9 08/06/2018  Decreased Interest 0  Down, Depressed, Hopeless 0  PHQ - 2 Score 0        Assessment/plan: 1. Annual physical exam utd on pap smear. Information given for mmg. tdap given today. Due for pap in 04/2019. Routine labs today. She is very thin and healthy. Works at a barn, rides horses. Continue healthy lifestyle did recommend more cardiovascular exercise. F/u for pap in November.  Patient counseling [x]    Nutrition: Stressed importance of moderation in sodium/caffeine intake, saturated fat and cholesterol, caloric balance, sufficient intake of fresh fruits, vegetables, fiber, calcium, iron, and 1 mg of  folate supplement per day (for females capable of pregnancy).  [x]    Stressed the importance of regular exercise.   []    Substance Abuse: Discussed cessation/primary prevention of tobacco, alcohol, or other drug use; driving or other dangerous activities under the influence; availability of treatment for abuse.   [x]    Injury prevention: Discussed safety belts, safety helmets, smoke detector, smoking near bedding or upholstery.   [x]    Sexuality: Discussed sexually transmitted diseases, partner selection, use of condoms, avoidance of unintended pregnancy  and contraceptive alternatives.  [x]    Dental health: Discussed importance of regular tooth brushing, flossing, and dental visits.  [x]   Health maintenance and immunizations reviewed. Please refer to Health maintenance section.    - CBC with Differential/Platelet - Comprehensive metabolic panel - Lipid panel - TSH  2. Tingling in extremities Large differentials. Checking labs, starting trial of gabapentin. If labs normal, will send to neuro for emg testing.  - ANA - B12  3. Pain in finger of both hands Discussed with patient Im really not sure. Will check labs and then send to ortho. 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 Will let ortho xray her as well.   - Rheumatoid factor - Uric acid - Ambulatory referral to Orthopedic Surgery  4. Dyshidrotic eczema Well controlled. Already has steroid cream. Discussed nature of this that it will never be cured. Will wax and wane. Continue steroid cream prn. Handout given on other lifestyle changes she can implement to help this as well.   5. Need for Tdap vaccination  - Tdap vaccine greater than or equal to 7yo IM  6. Encounter for screening for HIV  - HIV Antibody (routine testing w rflx)   Return in about 9 months (around 05/07/2019) for pap smear .   Orland MustardAllison Kanen Mottola, MD [facility]   08/06/2018

## 2018-08-06 NOTE — Patient Instructions (Signed)
Checking lots of labs for feeling in your arms/legs and bumps as well as routine lab work Start gabapentin as needed at night for tingling. If labs normal would send to neurology.   -referral to ortho hand for the painful bumps!    Dyshidrotic Eczema Dyshidrotic eczema (pompholyx) is a type of eczema that causes very itchy (pruritic), fluid-filled blisters (vesicles) to form on the hands and feet. It can affect people of any age, but is more common before the age of 31. There is no cure, but treatment and certain lifestyle changes can help relieve symptoms. What are the causes? The cause of this condition is not known. What increases the risk? You are more likely to develop this condition if:  You wash your hands frequently.  You have a personal history or family history of eczema, allergies, asthma, or hay fever.  You are allergic to metals such as nickel or cobalt.  You work with cement.  You smoke. What are the signs or symptoms? Symptoms of this condition may affect the hands, feet, or both. Symptoms may come and go (recur), and may include:  Severe itching, which may happen before blisters appear.  Blisters. These may form suddenly. ? In the early stages, blisters may form near the fingertips. ? In severe cases, blisters may grow to large blister masses (bullae). ? Blisters resolve in 2-3 weeks without bursting. This is followed by a dry phase in which itching eases.  Pain and swelling.  Cracks or long, narrow openings (fissures) in the skin.  Severe dryness.  Ridges on the nails. How is this diagnosed? This condition may be diagnosed based on:  A physical exam.  Your symptoms.  Your medical history.  Skin scrapings to rule out a fungal infection.  Testing a swab of fluid for bacteria (culture).  Removing and checking a small piece of skin (biopsy) in order to test for infection or to rule out other conditions.  Skin patch tests. These tests involve taking  patches that contain possible allergens and placing them on your back. Your health care provider will wait a few days and then check to see if an allergic reaction occurred. These tests may be done if your health care provider suspects allergic reactions, or to rule out other types of eczema. You may be referred to a health care provider who specializes in the skin (dermatologist) to help diagnose and treat this condition. How is this treated? There is no cure for this condition, but treatment can help relieve symptoms. Depending on how many blisters you have and how severe they are, your health care provider may suggest:  Avoiding allergens, irritants, or triggers that worsen symptoms. This may involve lifestyle changes such as: ? Using different lotions or soaps. ? Avoiding hot weather or places that will cause you to sweat a lot. ? Managing stress with coping techniques such as relaxation and exercise, and asking for help when you need it. ? Diet changes as recommended by your health care provider.  Using a clean, damp towel (cool compress) to relieve symptoms.  Soaking in a bath that contains a type of salt that relieves irritation (aluminum acetate soaks).  Medicine taken by mouth to reduce itching (oral antihistamines).  Medicine applied to the skin to reduce swelling and irritation (topical corticosteroids).  Medicine that reduces the activity of the body's disease-fighting system (immunosuppressants) to treat inflammation. This may be given in severe cases.  Antibiotic medicines to treat bacterial infection.  Light therapy (phototherapy). This  involves shining ultraviolet (UV) light on affected skin in order to reduce itchiness and inflammation. Follow these instructions at home: Bathing and skin care   Wash skin gently. After bathing or washing your hands, pat your skin dry. Avoid rubbing your skin.  Remove all jewelry before bathing. If the skin under the jewelry stays wet,  blisters may form or get worse.  Apply cool compresses as told by your health care provider: ? Soak a clean towel in cool water. ? Wring out excess water until towel is damp. ? Place the towel over affected skin. Leave the towel on for 20 minutes at a time, 2-3 times a day.  Use mild soaps, cleansers, and lotions that do not contain dyes, perfumes, or other irritants.  Keep your skin hydrated. To do this: ? Avoid very hot water. Take lukewarm baths or showers. ? Apply moisturizer within three minutes of bathing. This locks in moisture. Medicines  Take and apply over-the-counter and prescription medicines only as told by your health care provider.  If you were prescribed antibiotic medicine, take or apply it as told by your health care provider. Do not stop using the antibiotic even if you start to feel better. General instructions  Identify and avoid triggers and allergens.  Keep fingernails short to avoid breaking open the skin while scratching.  Use waterproof gloves to protect your hands when doing work that keeps your hands wet for a long time.  Wear socks to keep your feet dry.  Do not use any products that contain nicotine or tobacco, such as cigarettes and e-cigarettes. If you need help quitting, ask your health care provider.  Keep all follow-up visits as told by your health care provider. This is important. Contact a health care provider if:  You have symptoms that do not go away.  You have signs of infection, such as: ? Crusting, pus, or a bad smell. ? More redness, swelling, or pain. ? Increased warmth in the affected area. Summary  Dyshidrotic eczema (pompholyx) is a type of eczema that causes very itchy (pruritic), fluid-filled blisters (vesicles) to form on the hands and feet.  The cause of this condition is not known.  There is no cure for this condition, but treatment can help relieve symptoms. Treatment depends on how many blisters you have and how severe  they are.  Use mild soaps, cleansers, and lotions that do not contain dyes, perfumes, or other irritants. Keep your skin hydrated. This information is not intended to replace advice given to you by your health care provider. Make sure you discuss any questions you have with your health care provider. Document Released: 10/16/2016 Document Revised: 10/16/2016 Document Reviewed: 10/16/2016 Elsevier Interactive Patient Education  2019 [employer].

## 2018-08-09 LAB — RHEUMATOID FACTOR: Rhuematoid fact SerPl-aCnc: <14 IU/mL (ref <14)

## 2018-08-09 LAB — HIV ANTIBODY (ROUTINE TESTING W REFLEX): HIV: NONREACTIVE

## 2018-08-09 LAB — ANA: ANA: NEGATIVE

## 2018-08-16 ENCOUNTER — Encounter: Payer: Self-pay | Admitting: Family Medicine

## 2018-08-18 DIAGNOSIS — M79641 Pain in right hand: Secondary | ICD-10-CM | POA: Insufficient documentation

## 2018-08-18 DIAGNOSIS — M79642 Pain in left hand: Secondary | ICD-10-CM | POA: Insufficient documentation

## 2019-03-21 ENCOUNTER — Other Ambulatory Visit: Payer: Self-pay

## 2019-03-21 DIAGNOSIS — Z20822 Contact with and (suspected) exposure to covid-19: Secondary | ICD-10-CM

## 2019-03-22 LAB — NOVEL CORONAVIRUS, NAA: SARS-CoV-2, NAA: NOT DETECTED

## 2019-04-18 ENCOUNTER — Other Ambulatory Visit: Payer: Self-pay | Admitting: Obstetrics and Gynecology

## 2019-04-18 ENCOUNTER — Other Ambulatory Visit (HOSPITAL_COMMUNITY)
Admission: RE | Admit: 2019-04-18 | Discharge: 2019-04-18 | Disposition: A | Discharge status: Home / Self Care | Payer: 59 | Source: Ambulatory Visit | Attending: Obstetrics and Gynecology | Admitting: Obstetrics and Gynecology

## 2019-04-18 DIAGNOSIS — Z01419 Encounter for gynecological examination (general) (routine) without abnormal findings: Secondary | ICD-10-CM | POA: Insufficient documentation

## 2019-04-20 LAB — CYTOLOGY - PAP
Comment: NEGATIVE
Diagnosis: NEGATIVE
High risk HPV: NEGATIVE

## 2019-04-22 ENCOUNTER — Other Ambulatory Visit: Payer: Self-pay | Admitting: Obstetrics and Gynecology

## 2019-04-22 DIAGNOSIS — Z1231 Encounter for screening mammogram for malignant neoplasm of breast: Secondary | ICD-10-CM

## 2019-06-15 ENCOUNTER — Other Ambulatory Visit: Payer: Self-pay

## 2019-06-15 ENCOUNTER — Ambulatory Visit
Admission: RE | Admit: 2019-06-15 | Discharge: 2019-06-15 | Disposition: A | Discharge status: Home / Self Care | Payer: 59 | Source: Ambulatory Visit | Attending: Obstetrics and Gynecology | Admitting: Obstetrics and Gynecology

## 2019-06-15 DIAGNOSIS — Z1231 Encounter for screening mammogram for malignant neoplasm of breast: Secondary | ICD-10-CM

## 2019-10-03 ENCOUNTER — Other Ambulatory Visit: Payer: Self-pay | Admitting: Family Medicine

## 2020-06-22 IMAGING — MG DIGITAL SCREENING BILAT W/ TOMO W/ CAD
8 series · 9 of 24 positions shown · non-contrast
Comparison: Previous exam(s).

CLINICAL DATA: Screening.

EXAM:
DIGITAL SCREENING BILATERAL MAMMOGRAM WITH TOMO AND CAD

[R MLO synth-2D]
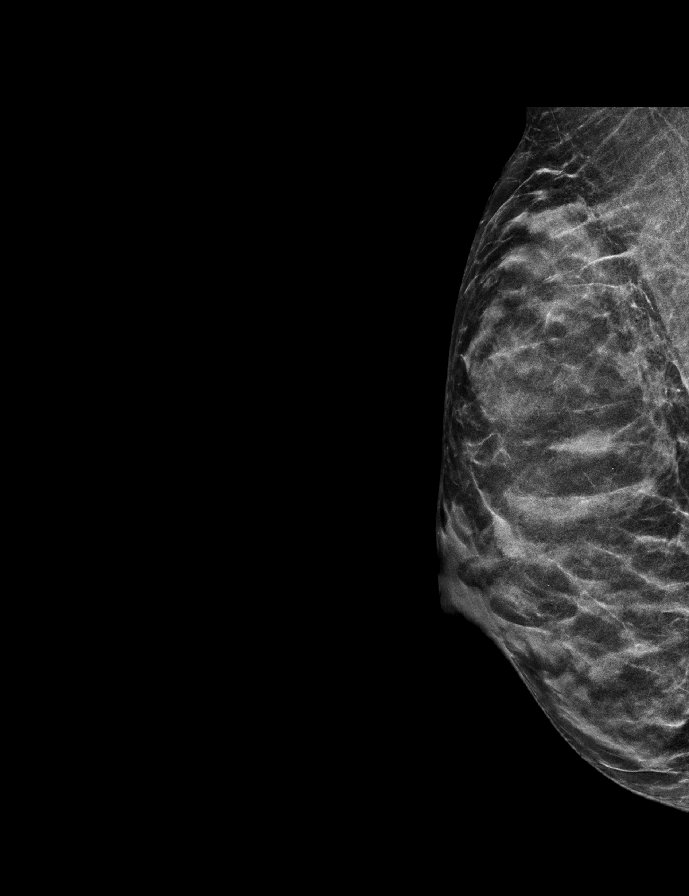

[R CC synth-2D]
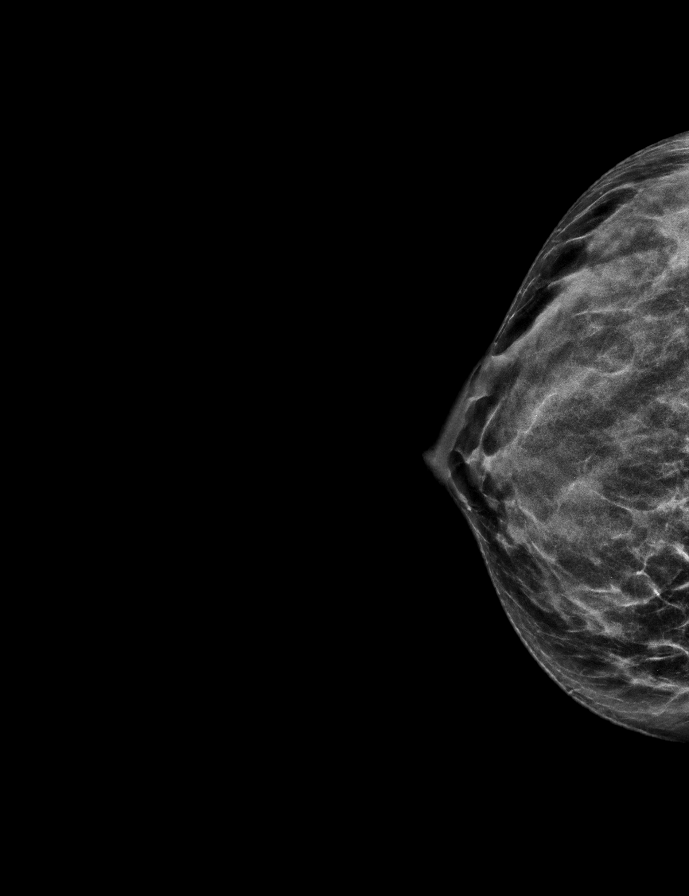

[L MLO synth-2D]
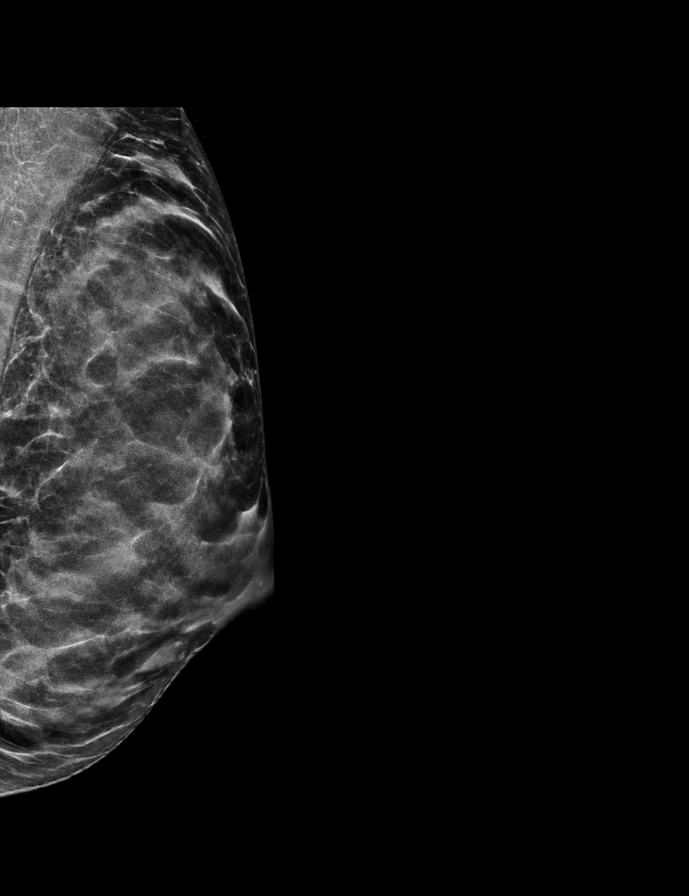

[L CC synth-2D]
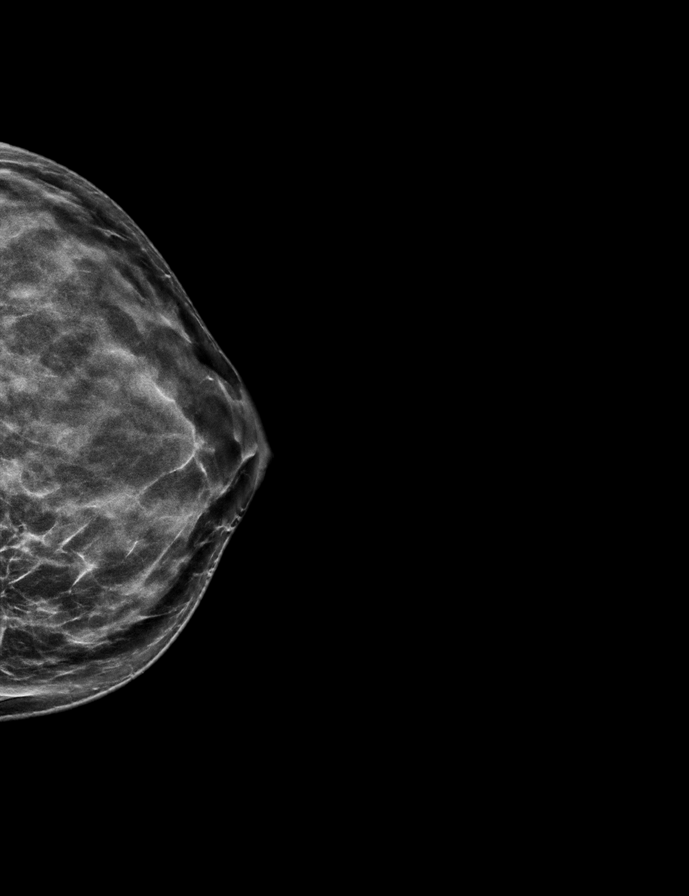

[R CC tomo · 2 of 47 frames shown]
[frame 16/47]
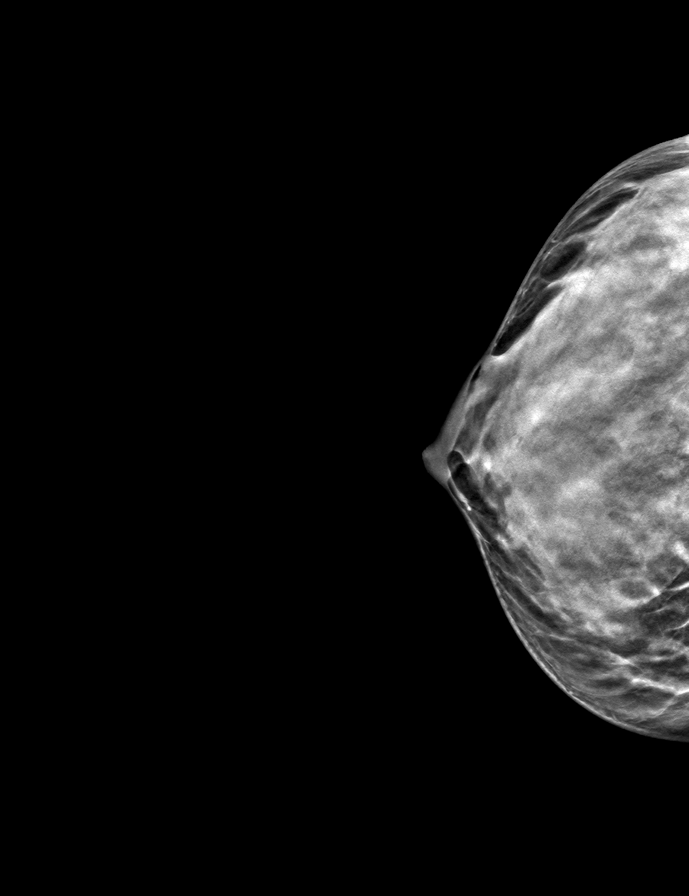
[frame 24/47]
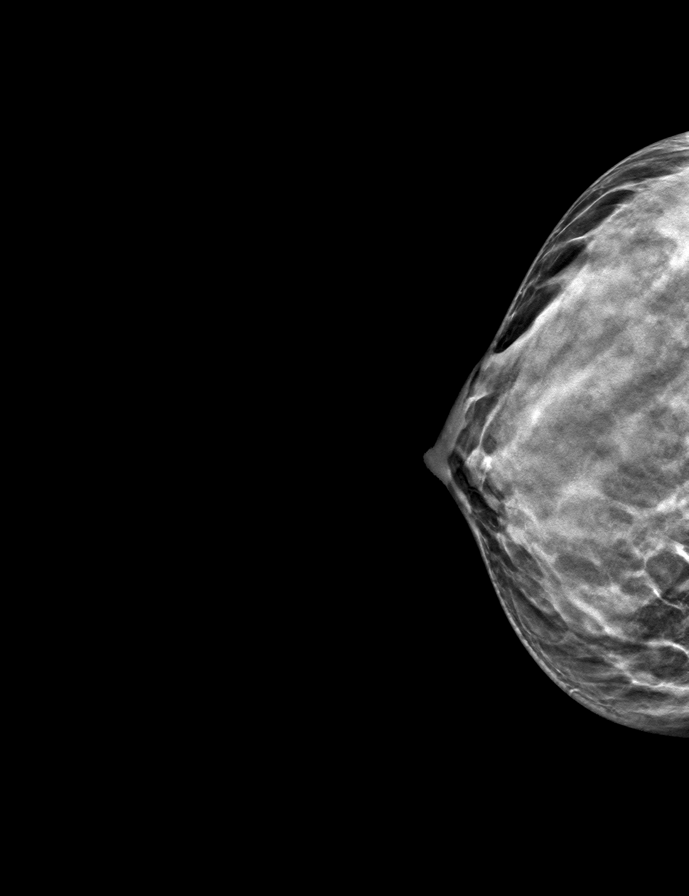

[R MLO tomo · tomo slice 23/44.0]
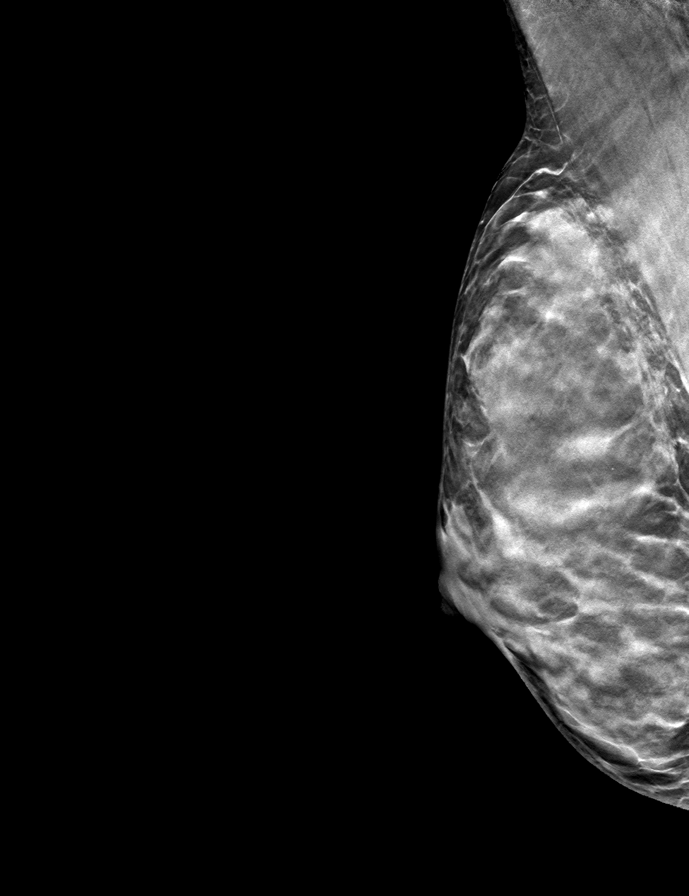

[L CC tomo · tomo slice 28/55.0]
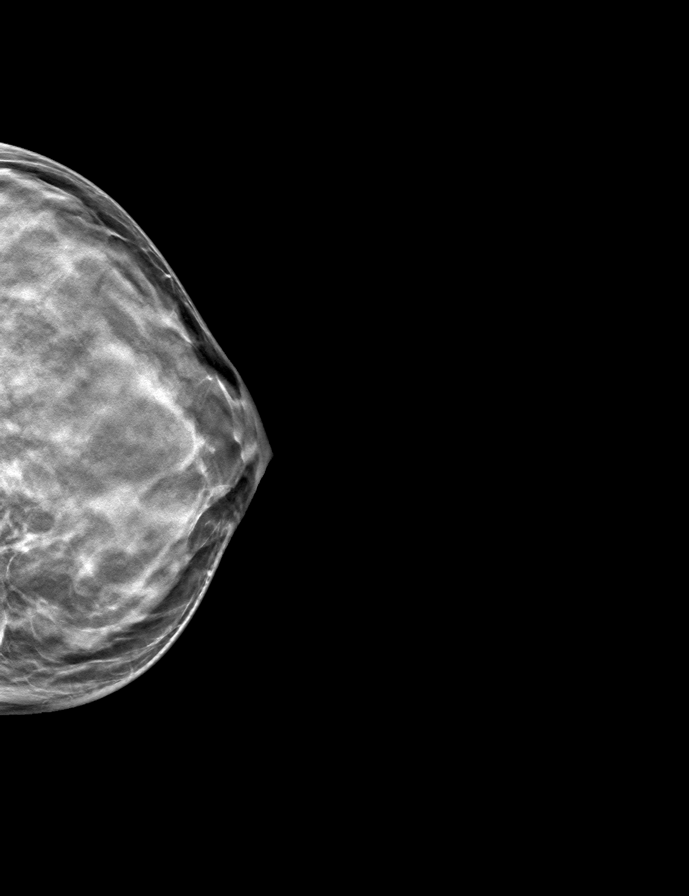

[L MLO tomo · tomo slice 25/48.0]
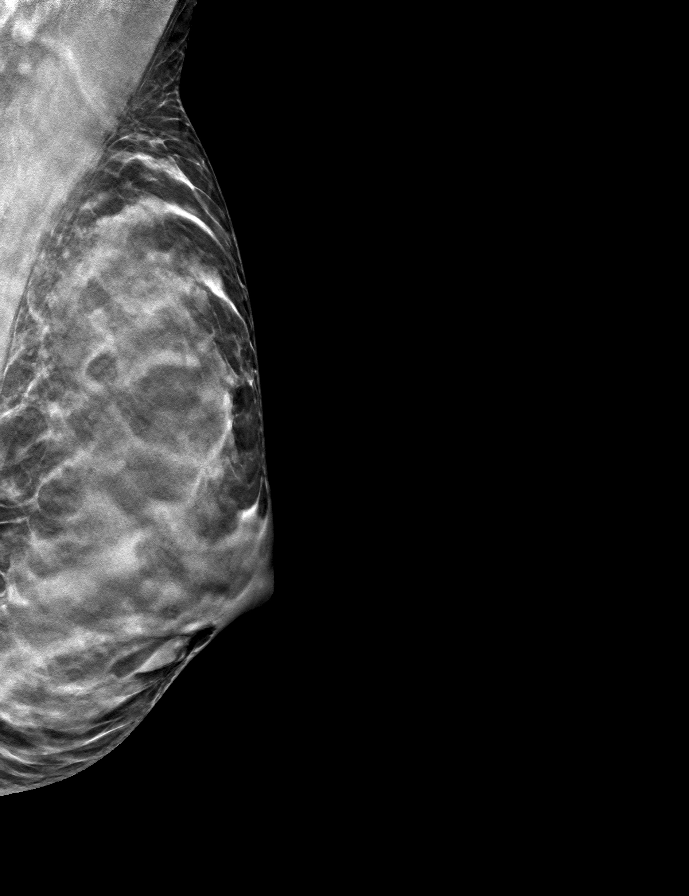

[9 of 24 positions shown; findings below may reference images not displayed]

ACR Breast Density Category d: The breast tissue is extremely dense,
which lowers the sensitivity of mammography
FINDINGS: There are no findings suspicious for malignancy. Images were
processed with CAD.
IMPRESSION: No mammographic evidence of malignancy. A result letter of this
screening mammogram will be mailed directly to the patient.

RECOMMENDATION:
Screening mammogram in one year. (Code:WO-[id number])

BI-RADS CATEGORY  1: Negative.

## 2020-07-06 DIAGNOSIS — Z20822 Contact with and (suspected) exposure to covid-19: Secondary | ICD-10-CM | POA: Diagnosis not present

## 2020-11-26 ENCOUNTER — Ambulatory Visit: Payer: 59 | Admitting: Family Medicine

## 2020-12-05 ENCOUNTER — Other Ambulatory Visit: Payer: Self-pay

## 2020-12-05 ENCOUNTER — Ambulatory Visit (INDEPENDENT_AMBULATORY_CARE_PROVIDER_SITE_OTHER): Payer: 59 | Admitting: Family Medicine

## 2020-12-05 ENCOUNTER — Encounter: Payer: Self-pay | Admitting: Family Medicine

## 2020-12-05 VITALS — BP 108/71 | HR 59 | Temp 98.4°F | Ht 67.0 in | Wt 119.0 lb

## 2020-12-05 DIAGNOSIS — I951 Orthostatic hypotension: Secondary | ICD-10-CM

## 2020-12-05 DIAGNOSIS — F4329 Adjustment disorder with other symptoms: Secondary | ICD-10-CM

## 2020-12-05 DIAGNOSIS — R69 Illness, unspecified: Secondary | ICD-10-CM | POA: Diagnosis not present

## 2020-12-05 LAB — TSH: TSH: 1.04 u[IU]/mL (ref 0.35–4.50)

## 2020-12-05 LAB — COMPREHENSIVE METABOLIC PANEL
ALT: 13 U/L (ref 0–35)
AST: 15 U/L (ref 0–37)
Albumin: 4.5 g/dL (ref 3.5–5.2)
Alkaline Phosphatase: 49 U/L (ref 39–117)
BUN: 17 mg/dL (ref 6–23)
CO2: 28 mEq/L (ref 19–32)
Calcium: 9.2 mg/dL (ref 8.4–10.5)
Chloride: 104 mEq/L (ref 96–112)
Creatinine, Ser: 0.68 mg/dL (ref 0.40–1.20)
GFR: 106.58 mL/min (ref >60.00)
Glucose, Bld: 79 mg/dL (ref 70–99)
Potassium: 4 mEq/L (ref 3.5–5.1)
Sodium: 139 mEq/L (ref 135–145)
Total Bilirubin: 0.7 mg/dL (ref 0.2–1.2)
Total Protein: 7 g/dL (ref 6.0–8.3)

## 2020-12-05 LAB — URINALYSIS, ROUTINE W REFLEX MICROSCOPIC
Bilirubin Urine: NEGATIVE
Hgb urine dipstick: NEGATIVE
Ketones, ur: NEGATIVE
Nitrite: NEGATIVE
RBC / HPF: NONE SEEN (ref 0–?)
Specific Gravity, Urine: 1.015 (ref 1.000–1.030)
Total Protein, Urine: NEGATIVE
Urine Glucose: NEGATIVE
Urobilinogen, UA: 0.2 (ref 0.0–1.0)
pH: 7 (ref 5.0–8.0)

## 2020-12-05 LAB — CBC
HCT: 38.2 % (ref 36.0–46.0)
Hemoglobin: 12.9 g/dL (ref 12.0–15.0)
MCHC: 33.8 g/dL (ref 30.0–36.0)
MCV: 92.5 fl (ref 78.0–100.0)
Platelets: 180 10*3/uL (ref 150.0–400.0)
RBC: 4.13 Mil/uL (ref 3.87–5.11)
RDW: 12.6 % (ref 11.5–15.5)
WBC: 6 10*3/uL (ref 4.0–10.5)

## 2020-12-05 LAB — HEMOGLOBIN A1C: Hgb A1c MFr Bld: 5.5 % (ref 4.6–6.5)

## 2020-12-05 NOTE — Progress Notes (Signed)
   Christine Lawrence is a 45 y.o. female who presents today for an office visit.  Assessment/Plan:  Dizziness/Syncope No current symptoms though this is a uncontrolled chronic issue.  Seems to be mostly positional in nature and has orthostatic component.  She did not have significant drop in blood pressure today though did have a 20 point rise in heart rate on her orthostatic vitals.  Also experienced dizziness while orthostatic vitals were being checked.  She may have POTS and is probably not adequately hydrated.  We will check labs today including UA, CBC, c-Met, TSH, and A1c.  Due to chronicity of symptoms and numerous syncopal episodes in the past will place referral to cardiology for further evaluation.  Encouraged good oral hydration and stressed importance of eating at regular intervals.  Discussed reasons for return to care and seek emergent care.    Subjective:  HPI: She complains of dizziness, and has concerns regarding the possibility of a genetic heart condition, due to her father and grandfather dying due to heart complications. She admits to episodes of syncope, and can tell when the episodes will occur in advance. The most recent episodes she notes were in the time period close to her father's death and funeral. She attributes the syncope episodes to stress, and missing meals. She notes that if she bends over enough to put her head below the height where her heart is when standing, that she will experience syncope. Additionally she notes her hands shaking which is alleviated by eating, but has trouble making sure that she eats, and drinks enough water. 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  She has had syncopal episodes intermittently for years and always knows when it is going to happen. She can precipitate the feelings of dizziness/presyncope by putting her head below her heart and then rising quickly. No chest pain or shortness of breath.            Objective:  Physical Exam: BP 108/71   Pulse (!) 59   Temp 98.4 F (36.9 C) (Temporal)   Ht _0  (1.702 m)   Wt 119 lb (54 kg)   LMP 11/02/2020   SpO2 100%   BMI 18.64 kg/m   Orthostatic VS for the past 72 hrs (Last 3 readings):  Orthostatic BP Patient Position Orthostatic Pulse  12/05/20 1135 117/75 Standing 75  12/05/20 1134 99/65 Sitting 56  12/05/20 1133 110/66 Supine 55   Gen: No acute distress, resting comfortably CV: Regular rate and rhythm with no murmurs appreciated Pulm: Normal work of breathing, clear to auscultation bilaterally with no crackles, wheezes, or rhonchi Neuro: Grossly normal, moves all extremities Psych: Normal affect and thought content       I,Jordan Kelly,acting as a scribe for Dimas Chyle, MD.,have documented all relevant documentation on the behalf of Dimas Chyle, MD,as directed by  Dimas Chyle, MD while in the presence of Dimas Chyle, MD.  I, Dimas Chyle, MD, have reviewed all documentation for this visit. The documentation on 12/05/20 for the exam, diagnosis, procedures, and orders are all accurate and complete.   Algis Greenhouse. Jerline Pain, MD 12/05/2020 11:50 AM

## 2020-12-05 NOTE — Patient Instructions (Signed)
It was very nice to see you today!  We will check blood work and urine sample today.  Due to how long her symptoms have been going on I will place a referral to the cardiologist for further evaluation.  Please let us know if your symptoms worsen or change.  Take care, Dr Jimmey Ralph  PLEASE NOTE:  If you had any lab tests please let us know if you have not heard back within a few days. You may see your results on mychart before we have a chance to review them but we will give you a call once they are reviewed by [place]. If we ordered any referrals today, please let us know if you have not heard from their office within the next week.   Please try these tips to maintain a healthy lifestyle:  Eat at least 3 REAL meals and 1-2 snacks per day.  Aim for no more than 5 hours between eating.  If you eat breakfast, please do so within one hour of getting up.   Each meal should contain half fruits/vegetables, one quarter protein, and one quarter carbs (no bigger than a computer mouse)  Cut down on sweet beverages. This includes juice, soda, and sweet tea.   Drink at least 1 glass of water with each meal and aim for at least 8 glasses per day  Exercise at least 150 minutes every week.    Orthostatic Hypotension Blood pressure is a measurement of how strongly, or weakly, your blood is pressing against the walls of your arteries. Orthostatic hypotension is a sudden drop in blood pressure that happens when you quickly change positions,such as when you get up from sitting or lying down. Arteries are blood vessels that carry blood from your heart throughout your body. When blood pressure is too low, you may not get enough blood to your brain or to the rest of your organs. This can cause weakness, light-headedness, rapid heartbeat, and fainting. This can last for just a few seconds or for up to a few minutes. Orthostatic hypotension is usually not a serious problem. However, if it happens frequently or gets worse,  it may be a sign of somethingmore serious. What are the causes? This condition may be caused by: Sudden changes in posture, such as standing up quickly after you have been sitting or lying down. Blood loss. Loss of body fluids (dehydration). Heart problems. Hormone (endocrine) problems. Pregnancy. Severe infection. Lack of certain nutrients. Severe allergic reactions (anaphylaxis). Certain medicines, such as blood pressure medicine or medicines that make the body lose excess fluids (diuretics). Sometimes, this condition can be caused by not taking medicine as directed, such as taking too much of a certain medicine. What increases the risk? The following factors may make you more likely to develop this condition: Age. Risk increases as you get older. Conditions that affect the heart or the central nervous system. Taking certain medicines, such as blood pressure medicine or diuretics. Being pregnant. What are the signs or symptoms? Symptoms of this condition may include: Weakness. Light-headedness. Dizziness. Blurred vision. Fatigue. Rapid heartbeat. Fainting, in severe cases. How is this diagnosed? This condition is diagnosed based on: Your medical history. Your symptoms. Your blood pressure measurement. Your health care provider will check your blood pressure when you are: Lying down. Sitting. Standing. A blood pressure reading is recorded as two numbers, such as "120 over 80" (or 120/80). The first ("top") number is called the systolic pressure. It is a measure of the pressure in  your arteries as your heart beats. The second ("bottom") number is called the diastolic pressure. It is a measure of the pressure in your arteries when your heart relaxes between beats. Blood pressure is measured in a unit called mm Hg. Healthy blood pressure for most adults is 120/80. If your blood pressure is below 90/60, you may be diagnosed withhypotension. Other information or tests that may be  used to diagnose orthostatic hypotension include: Your other vital signs, such as your heart rate and temperature. Blood tests. Tilt table test. For this test, you will be safely secured to a table that moves you from a lying position to an upright position. Your heart rhythm and blood pressure will be monitored during the test. How is this treated? This condition may be treated by: Changing your diet. This may involve eating more salt (sodium) or drinking more water. Taking medicines to raise your blood pressure. Changing the dosage of certain medicines you are taking that might be lowering your blood pressure. Wearing compression stockings. These stockings help to prevent blood clots and reduce swelling in your legs. In some cases, you may need to go to the hospital for: Fluid replacement. This means you will receive fluids through an IV. Blood replacement. This means you will receive donated blood through an IV (transfusion). Treating an infection or heart problems, if this applies. Monitoring. You may need to be monitored while medicines that you are taking wear off. Follow these instructions at home: Eating and drinking  Drink enough fluid to keep your urine pale yellow. Eat a healthy diet, and follow instructions from your health care provider about eating or drinking restrictions. A healthy diet includes: Fresh fruits and vegetables. Whole grains. Lean meats. Low-fat dairy products. Eat extra salt only as directed. Do not add extra salt to your diet unless your health care provider told you to do that. Eat frequent, small meals. Avoid standing up suddenly after eating.  Medicines Take over-the-counter and prescription medicines only as told by your health care provider. Follow instructions from your health care provider about changing the dosage of your current medicines, if this applies. Do not stop or adjust any of your medicines on your own. General instructions  Wear  compression stockings as told by your health care provider. Get up slowly from lying down or sitting positions. This gives your blood pressure a chance to adjust. Avoid hot showers and excessive heat as directed by your health care provider. Return to your normal activities as told by your health care provider. Ask your health care provider what activities are safe for you. Do not use any products that contain nicotine or tobacco, such as cigarettes, e-cigarettes, and chewing tobacco. If you need help quitting, ask your health care provider. Keep all follow-up visits as told by your health care provider. This is important.  Contact a health care provider if you: Vomit. Have diarrhea. Have a fever for more than 2-3 days. Feel more thirsty than usual. Feel weak and tired. Get help right away if you: Have chest pain. Have a fast or irregular heartbeat. Develop numbness in any part of your body. Cannot move your arms or your legs. Have trouble speaking. Become sweaty or feel light-headed. Faint. Feel short of breath. Have trouble staying awake. Feel confused. Summary Orthostatic hypotension is a sudden drop in blood pressure that happens when you quickly change positions. Orthostatic hypotension is usually not a serious problem. It is diagnosed by having your blood pressure taken lying down, sitting,  and then standing. It may be treated by changing your diet or adjusting your medicines. This information is not intended to replace advice given to you by your health care provider. Make sure you discuss any questions you have with your healthcare provider. Document Revised: 11/26/2017 Document Reviewed: 11/26/2017 Elsevier Patient Education  2022 [employer].

## 2020-12-06 NOTE — Progress Notes (Signed)
Please inform patient of the following:  Labs are all stable. Do not need to do any further testing. Would like for her to follow up with cardiology as we discussed.  [name]. Jimmey Ralph, MD 12/06/2020 12:58 PM

## 2021-01-31 DIAGNOSIS — I951 Orthostatic hypotension: Secondary | ICD-10-CM | POA: Insufficient documentation

## 2021-02-20 ENCOUNTER — Other Ambulatory Visit: Payer: Self-pay

## 2021-02-20 ENCOUNTER — Encounter: Payer: Self-pay | Admitting: Family Medicine

## 2021-02-20 ENCOUNTER — Ambulatory Visit (INDEPENDENT_AMBULATORY_CARE_PROVIDER_SITE_OTHER): Payer: 59 | Admitting: Family Medicine

## 2021-02-20 VITALS — BP 106/70 | HR 62 | Temp 98.2°F | Ht 67.0 in | Wt 125.0 lb

## 2021-02-20 DIAGNOSIS — Z0001 Encounter for general adult medical examination with abnormal findings: Secondary | ICD-10-CM

## 2021-02-20 DIAGNOSIS — R202 Paresthesia of skin: Secondary | ICD-10-CM

## 2021-02-20 DIAGNOSIS — M21619 Bunion of unspecified foot: Secondary | ICD-10-CM | POA: Diagnosis not present

## 2021-02-20 DIAGNOSIS — L309 Dermatitis, unspecified: Secondary | ICD-10-CM | POA: Diagnosis not present

## 2021-02-20 MED ORDER — GABAPENTIN 300 MG PO CAPS: ORAL_CAPSULE | ORAL | 3 refills | Status: DC

## 2021-02-20 MED ORDER — FLUOCINONIDE 0.05 % EX OINT: TOPICAL_OINTMENT | CUTANEOUS | 5 refills | Status: DC

## 2021-02-20 NOTE — Assessment & Plan Note (Signed)
Fluocinonide ointment refilled.  She is tolerating well.

## 2021-02-20 NOTE — Patient Instructions (Signed)
It was very nice to see you today!  I will place a referral for you to see a podiatrist.  I will refill your meds.  I will see you back in 1 year for your next annual checkup.  Please come back to see me sooner if needed.  Take care, Dr Jerline Pain  PLEASE NOTE:  If you had any lab tests please let us know if you have not heard back within a few days. You may see your results on mychart before we have a chance to review them but we will give you a call once they are reviewed by [place]. If we ordered any referrals today, please let us know if you have not heard from their office within the next week.   Please try these tips to maintain a healthy lifestyle:  Eat at least 3 REAL meals and 1-2 snacks per day.  Aim for no more than 5 hours between eating.  If you eat breakfast, please do so within one hour of getting up.   Each meal should contain half fruits/vegetables, one quarter protein, and one quarter carbs (no bigger than a computer mouse)  Cut down on sweet beverages. This includes juice, soda, and sweet tea.   Drink at least 1 glass of water with each meal and aim for at least 8 glasses per day  Exercise at least 150 minutes every week.    Preventive Care 44-74 Years Old, Female Preventive care refers to lifestyle choices and visits with your health care provider that can promote health and wellness. This includes: A yearly physical exam. This is also called an annual wellness visit. Regular dental and eye exams. Immunizations. Screening for certain conditions. Healthy lifestyle choices, such as: Eating a healthy diet. Getting regular exercise. Not using drugs or products that contain nicotine and tobacco. Limiting alcohol use. What can I expect for my preventive care visit? Physical exam Your health care provider will check your: Height and weight. These may be used to calculate your BMI (body mass index). BMI is a measurement that tells if you are at a healthy weight. Heart rate  and blood pressure. Body temperature. Skin for abnormal spots. Counseling Your health care provider may ask you questions about your: Past medical problems. Family's medical history. Alcohol, tobacco, and drug use. Emotional well-being. Home life and relationship well-being. Sexual activity. Diet, exercise, and sleep habits. Work and work Statistician. Access to firearms. Method of birth control. Menstrual cycle. Pregnancy history. What immunizations do I need? Vaccines are usually given at various ages, according to a schedule. Your health care provider will recommend vaccines for you based on your age, medical history, and lifestyle or other factors, such as travel or where you work. What tests do I need? Blood tests Lipid and cholesterol levels. These may be checked every 5 years, or more often if you are over 97 years old. Hepatitis C test. Hepatitis B test. Screening Lung cancer screening. You may have this screening every year starting at age 71 if you have a 30-pack-year history of smoking and currently smoke or have quit within the past 15 years. Colorectal cancer screening. All adults should have this screening starting at age 38 and continuing until age 21. Your health care provider may recommend screening at age 52 if you are at increased risk. You will have tests every 1-10 years, depending on your results and the type of screening test. Diabetes screening. This is done by checking your blood sugar (glucose) after you have  not eaten for a while (fasting). You may have this done every 1-3 years. Mammogram. This may be done every 1-2 years. Talk with your health care provider about when you should start having regular mammograms. This may depend on whether you have a family history of breast cancer. BRCA-related cancer screening. This may be done if you have a family history of breast, ovarian, tubal, or peritoneal cancers. Pelvic exam and Pap test. This may be done  every 3 years starting at age 37. Starting at age 34, this may be done every 5 years if you have a Pap test in combination with an HPV test. Other tests STD (sexually transmitted disease) testing, if you are at risk. Bone density scan. This is done to screen for osteoporosis. You may have this scan if you are at high risk for osteoporosis. Talk with your health care provider about your test results, treatment options, and if necessary, the need for more tests. Follow these instructions at home: Eating and drinking  Eat a diet that includes fresh fruits and vegetables, whole grains, lean protein, and low-fat dairy products. Take vitamin and mineral supplements as recommended by your health care provider. Do not drink alcohol if: Your health care provider tells you not to drink. You are pregnant, may be pregnant, or are planning to become pregnant. If you drink alcohol: Limit how much you have to 0-1 drink a day. Be aware of how much alcohol is in your drink. In the [place] one drink equals one 12 oz bottle of beer (355 mL), one 5 oz glass of wine (148 mL), or one 1 oz glass of hard liquor (44 mL). Lifestyle Take daily care of your teeth and gums. Brush your teeth every morning and night with fluoride toothpaste. Floss one time each day. Stay active. Exercise for at least 30 minutes 5 or more days each week. Do not use any products that contain nicotine or tobacco, such as cigarettes, e-cigarettes, and chewing tobacco. If you need help quitting, ask your health care provider. Do not use drugs. If you are sexually active, practice safe sex. Use a condom or other form of protection to prevent STIs (sexually transmitted infections). If you do not wish to become pregnant, use a form of birth control. If you plan to become pregnant, see your health care provider for a prepregnancy visit. If told by your health care provider, take low-dose aspirin daily starting at age 65. Find healthy ways to [place]  with stress, such as: Meditation, yoga, or listening to music. Journaling. Talking to a trusted person. Spending time with friends and family. Safety Always wear your seat belt while driving or riding in a vehicle. Do not drive: If you have been drinking alcohol. Do not ride with someone who has been drinking. When you are tired or distracted. While texting. Wear a helmet and other protective equipment during sports activities. If you have firearms in your house, make sure you follow all gun safety procedures. What's next? Visit your health care provider once a year for an annual wellness visit. Ask your health care provider how often you should have your eyes and teeth checked. Stay up to date on all vaccines. This information is not intended to replace advice given to you by your health care provider. Make sure you discuss any questions you have with your health care provider. Document Revised: 08/10/2020 Document Reviewed: 02/11/2018 Elsevier Patient Education  2022 [employer].

## 2021-02-20 NOTE — Assessment & Plan Note (Signed)
Gabapentin refilled.  Tolerating well.

## 2021-02-20 NOTE — Progress Notes (Signed)
Chief Complaint:  Christine Lawrence is a 45 y.o. female who presents today for her annual comprehensive physical exam.    Assessment/Plan:  New/Acute Problems: Bunion Will place referral to podiatry.  Chronic Problems Addressed Today: Paresthesias Gabapentin refilled.  Tolerating well.  Dermatitis Fluocinonide ointment refilled.  She is tolerating well.   Preventative Healthcare: Up-to-date on vaccines and screenings.  Due for mammogram later this year and Pap next year.  Patient Counseling(The following topics were reviewed and/or handout was given):  -Nutrition: Stressed importance of moderation in sodium/caffeine intake, saturated fat and cholesterol, caloric balance, sufficient intake of fresh fruits, vegetables, and fiber.  -Stressed the importance of regular exercise.   -Substance Abuse: Discussed cessation/primary prevention of tobacco, alcohol, or other drug use; driving or other dangerous activities under the influence; availability of treatment for abuse.   -Injury prevention: Discussed safety belts, safety helmets, smoke detector, smoking near bedding or upholstery.   -Sexuality: Discussed sexually transmitted diseases, partner selection, use of condoms, avoidance of unintended pregnancy and contraceptive alternatives.   -Dental health: Discussed importance of regular tooth brushing, flossing, and dental visits.  -Health maintenance and immunizations reviewed. Please refer to Health maintenance section.  Return to care in 1 year for next preventative visit.     Subjective:  HPI:  She has no acute complaints today.   She has had a painful bunion on her right foot for several years.  Lifestyle Diet: Balanced.  Exercise: None specific. Stays active with work.   Depression screen PHQ 2/9 02/20/2021  Decreased Interest 0  Down, Depressed, Hopeless 0  PHQ - 2 Score 0    Health Maintenance Due  Topic Date Due   Hepatitis C Screening  Never done     ROS: Per  HPI, otherwise a complete review of systems was negative.   PMH:  The following were reviewed and entered/updated in epic: Past Medical History:  Diagnosis Date   Generalized headaches    no recent headaches   Migraine    no recent migraines.   UTI (urinary tract infection)    Varicella 1982   Patient Active Problem List   Diagnosis Date Noted   Paresthesias 02/20/2021   Dermatitis 02/20/2021   Orthostatic hypotension 01/31/2021   Bilateral hand pain 08/18/2018   History reviewed. No pertinent surgical history.  Family History  Problem Relation Age of Onset   Hyperlipidemia Father    Hypertension Father    Heart disease Maternal Grandmother    Diabetes Maternal Grandmother    Arthritis Paternal Grandfather    Cancer Paternal Grandfather        prostate   Stroke Maternal Grandfather     Medications- reviewed and updated Current Outpatient Medications  Medication Sig Dispense Refill   fluocinonide ointment (LIDEX) 0.05 % APPLY ON THE SKIN TWICE DAILY 30 g 5   gabapentin (NEURONTIN) 300 MG capsule TAKE 1 CAPSULE(300 MG) BY MOUTH AT BEDTIME 90 capsule 3   No current facility-administered medications for this visit.    Allergies-reviewed and updated No Known Allergies  Social History   Socioeconomic History   Marital status: Married    Spouse name: Not on file   Number of children: 1   Years of education: 16   Highest education level: Not on file  Occupational History   Occupation: Mudlogger: [employer]    Comment: [employer]  Tobacco Use   Smoking status: Former    Packs/day: 0.50    Years: 10.00  Pack years: 5.00    Types: Cigarettes    Quit date: 04/04/2009    Years since quitting: 11.8   Smokeless tobacco: Never  Vaping Use   Vaping Use: Never used  Substance and Sexual Activity   Alcohol use: Yes    Comment: occasional wine   Drug use: No   Sexual activity: Yes    Partners: Male  Other Topics Concern   Not on file  Social  History Narrative   HS[employer], [employer] - [place] [employer]. Married '11. I dtr - '12. Work - Merchandiser, retail. Life is good.    Social Determinants of Health   Financial Resource Strain: Not on file  Food Insecurity: Not on file  Transportation Needs: Not on file  Physical Activity: Not on file  Stress: Not on file  Social Connections: Not on file        Objective:  Physical Exam: BP 106/70   Pulse 62   Temp 98.2 F (36.8 C) (Temporal)   Ht 5\' 7"  (1.702 m)   Wt 125 lb (56.7 kg)   LMP 02/19/2021   SpO2 99%   BMI 19.58 kg/m   Body mass index is 19.58 kg/m. Wt Readings from Last 3 Encounters:  02/20/21 125 lb (56.7 kg)  12/05/20 119 lb (54 kg)  08/06/18 116 lb 9.6 oz (52.9 kg)   Gen: NAD, resting comfortably HEENT: TMs normal bilaterally. OP clear. No thyromegaly noted.  CV: RRR with no murmurs appreciated Pulm: NWOB, CTAB with no crackles, wheezes, or rhonchi GI: Normal bowel sounds present. Soft, Nontender, Nondistended. MSK: no edema, cyanosis, or clubbing noted.  Bunion on right foot noted. Skin: warm, dry Neuro: CN2-12 grossly intact. Strength 5/5 in upper and lower extremities. Reflexes symmetric and intact bilaterally.  Psych: Normal affect and thought content     Christine Lawrence M. 08/08/18, MD 02/20/2021 8:50 AM

## 2021-02-25 ENCOUNTER — Ambulatory Visit: Payer: 59 | Admitting: Cardiovascular Disease

## 2021-02-27 ENCOUNTER — Ambulatory Visit (INDEPENDENT_AMBULATORY_CARE_PROVIDER_SITE_OTHER): Payer: 59

## 2021-02-27 ENCOUNTER — Encounter: Payer: Self-pay | Admitting: Podiatry

## 2021-02-27 ENCOUNTER — Other Ambulatory Visit: Payer: Self-pay

## 2021-02-27 ENCOUNTER — Other Ambulatory Visit: Payer: Self-pay | Admitting: Podiatry

## 2021-02-27 ENCOUNTER — Ambulatory Visit (INDEPENDENT_AMBULATORY_CARE_PROVIDER_SITE_OTHER): Payer: 59 | Admitting: Podiatry

## 2021-02-27 DIAGNOSIS — M21611 Bunion of right foot: Secondary | ICD-10-CM | POA: Diagnosis not present

## 2021-02-27 DIAGNOSIS — M21619 Bunion of unspecified foot: Secondary | ICD-10-CM

## 2021-02-27 DIAGNOSIS — M79671 Pain in right foot: Secondary | ICD-10-CM

## 2021-02-27 DIAGNOSIS — M21612 Bunion of left foot: Secondary | ICD-10-CM

## 2021-02-27 DIAGNOSIS — M79672 Pain in left foot: Secondary | ICD-10-CM

## 2021-02-27 NOTE — Patient Instructions (Signed)

## 2021-02-28 NOTE — Progress Notes (Signed)
Subjective:   Patient ID: Christine Lawrence, female   DOB: 45 y.o.   MRN: [id number]   HPI Patient presents with significant bunion deformity right over left and states its become increasingly sore over the last few years.  States that she has tried to treat this conservatively with shoe gear modifications and soaks anti-inflammatories without relief and does have family history of condition.  Patient does not smoke likes to be active   Review of Systems  All other systems reviewed and are negative.      Objective:  Physical Exam Vitals and nursing note reviewed.  Constitutional:      Appearance: She is well-developed.  Pulmonary:     Effort: Pulmonary effort is normal.  Musculoskeletal:        General: Normal range of motion.  Skin:    General: Skin is warm.  Neurological:     Mental Status: She is alert.    Neurovascular status intact muscle strength was found to be adequate range of motion adequate.  Patient is found to have large hyperostosis medial aspect first metatarsal head right over left with redness around the metatarsal head and moderate deviation of the hallux with no range of motion loss or crepitus.  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  I do think patient can have distal osteotomy for this but it may be difficult to get complete clinical correction but I do think it will can correct her discomfort she experiences.  I reviewed her x-rays discussed procedure she wants to get this done towards the end of the year all questions answered and information given to patient  X-rays indicate elevation of intermetatarsal angle right over left of approximate 15 degrees

## 2021-04-19 ENCOUNTER — Encounter: Payer: Self-pay | Admitting: Family Medicine

## 2021-04-19 ENCOUNTER — Telehealth (INDEPENDENT_AMBULATORY_CARE_PROVIDER_SITE_OTHER): Payer: 59 | Admitting: Family Medicine

## 2021-04-19 VITALS — Temp 101.0°F | Ht 67.01 in | Wt 115.0 lb

## 2021-04-19 DIAGNOSIS — R059 Cough, unspecified: Secondary | ICD-10-CM | POA: Diagnosis not present

## 2021-04-19 DIAGNOSIS — R509 Fever, unspecified: Secondary | ICD-10-CM

## 2021-04-19 NOTE — Progress Notes (Signed)
Phone [phone] Virtual visit via Video note   Subjective:  Chief complaint: Chief Complaint  Patient presents with   URI    Pt states she has a fever, cough, headache, head congestion, body aches and chills. Symptoms started Wednesday 11/2. Pt took two at home covid test that came back negative.    This visit type was conducted due to national recommendations for restrictions regarding the COVID-19 Pandemic (e.g. social distancing).  This format is felt to be most appropriate for this patient at this time balancing risks to patient and risks to population by having him in for in person visit.  No physical exam was performed (except for noted visual exam or audio findings with Telehealth visits).    Our team/I connected with Valentino Hue at  4:20 PM EDT by a video enabled telemedicine application ([email or web address] or caregility through epic) and verified that I am speaking with the correct person using two identifiers.  Location patient: [place] Location provider: [facility], office Persons participating in the virtual visit:  patient  Our team/I discussed the limitations of evaluation and management by telemedicine and the availability of in person appointments. In light of current covid-19 pandemic, patient also understands that we are trying to protect them by minimizing in office contact if at all possible.  The patient expressed consent for telemedicine visit and agreed to proceed. Patient understands insurance will be billed.   Past Medical History-  Patient Active Problem List   Diagnosis Date Noted   Paresthesias 02/20/2021   Dermatitis 02/20/2021   Orthostatic hypotension 01/31/2021   Bilateral hand pain 08/18/2018    Medications- reviewed and updated Current Outpatient Medications  Medication Sig Dispense Refill   fluocinonide ointment (LIDEX) 0.05 % APPLY ON THE SKIN TWICE DAILY 30 g 5   gabapentin (NEURONTIN) 300 MG capsule TAKE 1 CAPSULE(300 MG) BY MOUTH AT BEDTIME 90  capsule 3   No current facility-administered medications for this visit.     Objective:  Temp (!) 101 F (38.3 C)   Ht 5' 7.01" (1.702 m)   Wt 115 lb (52.2 kg)   BMI 18.01 kg/m  self reported vitals Gen: NAD, appears fatigued Lungs: nonlabored, normal respiratory rate  Skin: appears dry, no obvious rash     Assessment and Plan    # Fever and cough S: Patient presents with    Pt states she has a fever as high as 103 (down to 99 with tylenol but usually 101 or 102), cough, headache, head congestion, body aches/back pain and chills. No nausea/vomiting/diarrhea. No sick contacts. Chest congestion yesterday now improved. Symptoms started Wednesday 11/2. Pt took two at home covid test that came back negative. Some sore throat with coughing.  - doing honey and cough drops already  - has not had flu shot this year - 2 covid shots last year - went to a conference this past weekend and most got sick- some with covid but no known flu - no shortness of breath A/P: 45 year old previously healthy female presenting with flulike versus COVID-like illness.  Home test for COVID have been negative.  Unfortunately our visit was so late in the day we were unable to obtain COVID and flu testing.  We discussed possibly coming by on Monday if she has persistent symptoms through Sunday to test for these - We discussed potential empiric Tamiflu though she is slightly past 48 hours-she would prefer to remain off of medication - Discussed conservative care at home - Discussed reasons for  emergent follow-up or follow-up with [place] in the office including - Would get chest x-ray if persistent symptoms beyond 7 to 10 days - If has worsening symptoms would want to evaluate her in person -Offered [employer] and patient declines -Discussed another potential benefit of testing for flu would be Tamiflu prophylaxis for family members if she was positive-since she has been around them since Wednesday she would like  to hold off on this  Recommended follow up:  Future Appointments  Date Time Provider Department Center  05/20/2021  8:45 AM Lenn Sink, DPM [facility]  07/01/2021  2:15 PM Felecia Shelling, DPM [facility]  02/21/2022  8:20 AM Ardith Dark, MD [facility]    Lab/Order associations:   ICD-10-CM   1. Fever, unspecified fever cause  R50.9     2. Cough, unspecified type  R05.9       Time Spent: 14 minutes of total time (4:35 PM- 4:49 PM) was spent on the date of the encounter performing the following actions: chart review prior to seeing the patient, obtaining history, performing a medically necessary exam, counseling on the treatment plan, placing orders, and documenting in our EHR.    I,Jada Bradford,acting as a scribe for Tana Conch, MD.,have documented all relevant documentation on the behalf of Tana Conch, MD,as directed by  Tana Conch, MD while in the presence of Tana Conch, MD.   I, Tana Conch, MD, have reviewed all documentation for this visit. The documentation on 04/19/21 for the exam, diagnosis, procedures, and orders are all accurate and complete.  Return precautions advised.  Tana Conch, MD

## 2021-05-20 ENCOUNTER — Encounter: Payer: Self-pay | Admitting: Podiatry

## 2021-05-20 ENCOUNTER — Ambulatory Visit (INDEPENDENT_AMBULATORY_CARE_PROVIDER_SITE_OTHER): Payer: 59 | Admitting: Podiatry

## 2021-05-20 ENCOUNTER — Other Ambulatory Visit: Payer: Self-pay

## 2021-05-20 DIAGNOSIS — M21611 Bunion of right foot: Secondary | ICD-10-CM | POA: Diagnosis not present

## 2021-05-20 DIAGNOSIS — M21619 Bunion of unspecified foot: Secondary | ICD-10-CM

## 2021-05-20 NOTE — Progress Notes (Signed)
Subjective:   Patient ID: Christine Lawrence, female   DOB: 45 y.o.   MRN: [id number]   HPI Patient presents with chronic bunion deformity right over left that is painful and states that she is tried wider shoes she is tried soaks and other modalities without relief and wants surgical correction   ROS      Objective:  Physical Exam  Neurovascular status intact with patient found to have good digital perfusion and is noted to have structural malalignment first metatarsal bilateral right over left with redness and pain around the metatarsal itself     Assessment:  Chronic structural changes consistent with bunion deformity right over left     Plan:  H&P reviewed condition and recommended distal osteotomy and allowed her to read consent form reviewing alternative treatments complications.  Patient wants surgery understands correction and at this point after extensive review signed consent form and is given all preoperative instructions.  She understands all complications are possible and I cannot cross them out and that total recovery can take approximately 6 months and I did dispense air fracture walker today with all instructions on usage for the postoperative period and encouraged her to call with questions concerns which may arise prior to procedure and I want her to get used to her boot at this time prior to procedure

## 2021-06-04 ENCOUNTER — Telehealth: Payer: Self-pay | Admitting: Urology

## 2021-06-04 NOTE — Telephone Encounter (Signed)
DOS - 06/25/21  [place] BUNIONECTOMY RIGHT --- [id number]  AETNA EFFECTIVE DATE - 06/16/20  PER AETNA'S AUTOMATIVE SYSTEM FOR CPT CODE 70623 NO PRIOR AUTH IS REQUIRED.  REF # [id number]

## 2021-06-24 MED ORDER — OXYCODONE-ACETAMINOPHEN 10-325 MG PO TABS: 1.0000 | ORAL_TABLET | ORAL | 0 refills | Status: DC | PRN

## 2021-06-24 MED ORDER — ONDANSETRON HCL 4 MG PO TABS: 4.0000 mg | ORAL_TABLET | Freq: Three times a day (TID) | ORAL | 0 refills | Status: DC | PRN

## 2021-06-24 NOTE — Addendum Note (Signed)
Addended by: Lenn Sink on: 06/24/2021 12:02 PM   Modules accepted: Orders

## 2021-06-25 ENCOUNTER — Encounter: Payer: Self-pay | Admitting: Podiatry

## 2021-06-25 DIAGNOSIS — M2011 Hallux valgus (acquired), right foot: Secondary | ICD-10-CM | POA: Diagnosis not present

## 2021-06-27 ENCOUNTER — Other Ambulatory Visit: Payer: Self-pay | Admitting: Podiatry

## 2021-06-27 ENCOUNTER — Telehealth: Payer: Self-pay | Admitting: *Deleted

## 2021-06-27 MED ORDER — IBUPROFEN 600 MG PO TABS: 600.0000 mg | ORAL_TABLET | Freq: Three times a day (TID) | ORAL | 0 refills | Status: DC | PRN

## 2021-06-27 NOTE — Telephone Encounter (Signed)
Patient is calling because the percocet prescribed is causing sickness, nausea, Can Ibupfren 800 mg be sent to her pharmacy instead? Please advise.

## 2021-06-27 NOTE — Telephone Encounter (Signed)
Sent prescription for ibuprophen 600mg 

## 2021-06-28 NOTE — Telephone Encounter (Signed)
Notified patient thru vmessage that a medication has been sent to pharmacy on file.

## 2021-07-01 ENCOUNTER — Ambulatory Visit (INDEPENDENT_AMBULATORY_CARE_PROVIDER_SITE_OTHER): Payer: 59 | Admitting: Podiatry

## 2021-07-01 ENCOUNTER — Other Ambulatory Visit: Payer: Self-pay

## 2021-07-01 ENCOUNTER — Ambulatory Visit (INDEPENDENT_AMBULATORY_CARE_PROVIDER_SITE_OTHER): Payer: 59

## 2021-07-01 DIAGNOSIS — M2011 Hallux valgus (acquired), right foot: Secondary | ICD-10-CM

## 2021-07-01 DIAGNOSIS — Z9889 Other specified postprocedural states: Secondary | ICD-10-CM

## 2021-07-01 NOTE — Progress Notes (Signed)
° °  Subjective:  Patient presents today status post bunionectomy of the right foot. DOS: 06/25/2021.  Patient states that she is doing well.  She did have a bad reaction to the oxycodone.  Currently she says that she is taking Tylenol for relief.  Patient also states that the postsurgical shoe was rubbing the outside of her foot so she discontinued wearing the shoe.  She has been using the knee scooter and crutches for ambulation  Past Medical History:  Diagnosis Date   Generalized headaches    no recent headaches   Migraine    no recent migraines.   UTI (urinary tract infection)    Varicella 1982      Objective/Physical Exam Neurovascular status intact.  Skin incisions appear to be well coapted with sutures intact. No sign of infectious process noted. No dehiscence. No active bleeding noted.  No edema noted.  There is some ecchymosis noted to the forefoot  Radiographic Exam:  Crossing K wires and osteotomies sites appear to be stable with routine healing.  Good alignment of the first ray.  Assessment: 1. s/p bunionectomy with osteotomy right foot. DOS: 06/25/2021   Plan of Care:  1. Patient was evaluated. X-rays reviewed 2.  Dressings changed.  Patient may begin washing and showering and getting foot wet 3.  Continue minimal weightbearing using the assistance of crutches and the knee scooter 4.  Ace wrap provided.  Recommend Ace wrap daily 5.  Return to clinic in 1 week   Felecia Shelling, DPM [facility]  Dr. Felecia Shelling, DPM    [address] [place], [place] [address]                Office [phone]  Fax [phone]

## 2021-07-10 ENCOUNTER — Ambulatory Visit (INDEPENDENT_AMBULATORY_CARE_PROVIDER_SITE_OTHER): Payer: 59

## 2021-07-10 ENCOUNTER — Encounter: Payer: Self-pay | Admitting: Podiatry

## 2021-07-10 ENCOUNTER — Ambulatory Visit (INDEPENDENT_AMBULATORY_CARE_PROVIDER_SITE_OTHER): Payer: 59 | Admitting: Podiatry

## 2021-07-10 ENCOUNTER — Other Ambulatory Visit: Payer: Self-pay

## 2021-07-10 DIAGNOSIS — Z9889 Other specified postprocedural states: Secondary | ICD-10-CM | POA: Diagnosis not present

## 2021-07-13 NOTE — Progress Notes (Signed)
Subjective:   Patient ID: Christine Lawrence, female   DOB: 46 y.o.   MRN: [id number]   HPI Patient states she is doing very well with surgery and very pleased with her results and is walking with a good heel toe gait in her boot neuro   ROS      Objective:  Physical Exam  Vascular status intact negative Denna Haggard' sign noted patient's right first MPJ healing well wound edges well coapted good range of motion no crepitus of the joint     Assessment:  Doing well post osteotomy first metatarsal right     Plan:  Reviewed the continuation of immobilization dispensed surgical shoe discussed range of motion exercises and gradual increase in activity.  Dispensed ankle compression stocking reappoint to recheck  X-rays indicate osteotomy is healing well fixation in place joint congruence

## 2021-08-07 ENCOUNTER — Encounter: Payer: Self-pay | Admitting: Podiatry

## 2021-08-07 ENCOUNTER — Ambulatory Visit (INDEPENDENT_AMBULATORY_CARE_PROVIDER_SITE_OTHER): Payer: 59 | Admitting: Podiatry

## 2021-08-07 ENCOUNTER — Ambulatory Visit (INDEPENDENT_AMBULATORY_CARE_PROVIDER_SITE_OTHER): Payer: 59

## 2021-08-07 ENCOUNTER — Other Ambulatory Visit: Payer: Self-pay

## 2021-08-07 DIAGNOSIS — Z9889 Other specified postprocedural states: Secondary | ICD-10-CM

## 2021-08-08 NOTE — Progress Notes (Signed)
Subjective:   Patient ID: Christine Lawrence, female   DOB: 46 y.o.   MRN: [id number]   HPI Patient states doing well with surgery and states that the alignment is satisfactory and she has been getting back into shoe gear   ROS      Objective:  Physical Exam  Neuro vascular status intact negative Denna Haggard' sign noted wound edges coapted well mild reduction range of motion of the first MPJ good alignment noted and correction      Assessment:  Overall doing well post osteotomy first metatarsal right foot     Plan:  X-rays reviewed continue to increase activity and encourage range of motion of the first MPJ going over walking on the whole foot and strengthening exercises for both the dorsiflexion plantarflexion of the joint  X-rays indicate osteotomies healing well joint congruence fixation in place

## 2022-02-21 ENCOUNTER — Encounter: Payer: Self-pay | Admitting: Family Medicine

## 2022-02-21 ENCOUNTER — Ambulatory Visit (INDEPENDENT_AMBULATORY_CARE_PROVIDER_SITE_OTHER): Payer: 59 | Admitting: Family Medicine

## 2022-02-21 VITALS — BP 96/66 | HR 63 | Temp 98.2°F | Ht 67.0 in | Wt 125.8 lb

## 2022-02-21 DIAGNOSIS — L659 Nonscarring hair loss, unspecified: Secondary | ICD-10-CM

## 2022-02-21 DIAGNOSIS — R739 Hyperglycemia, unspecified: Secondary | ICD-10-CM

## 2022-02-21 DIAGNOSIS — Z1159 Encounter for screening for other viral diseases: Secondary | ICD-10-CM

## 2022-02-21 DIAGNOSIS — L309 Dermatitis, unspecified: Secondary | ICD-10-CM | POA: Diagnosis not present

## 2022-02-21 DIAGNOSIS — R202 Paresthesia of skin: Secondary | ICD-10-CM

## 2022-02-21 DIAGNOSIS — Z0001 Encounter for general adult medical examination with abnormal findings: Secondary | ICD-10-CM

## 2022-02-21 DIAGNOSIS — E785 Hyperlipidemia, unspecified: Secondary | ICD-10-CM | POA: Diagnosis not present

## 2022-02-21 LAB — COMPREHENSIVE METABOLIC PANEL
ALT: 17 U/L (ref 0–35)
AST: 23 U/L (ref 0–37)
Albumin: 4.5 g/dL (ref 3.5–5.2)
Alkaline Phosphatase: 54 U/L (ref 39–117)
BUN: 14 mg/dL (ref 6–23)
CO2: 30 mEq/L (ref 19–32)
Calcium: 9.5 mg/dL (ref 8.4–10.5)
Chloride: 102 mEq/L (ref 96–112)
Creatinine, Ser: 0.74 mg/dL (ref 0.40–1.20)
GFR: 98.17 mL/min (ref >60.00)
Glucose, Bld: 83 mg/dL (ref 70–99)
Potassium: 5.2 mEq/L — ABNORMAL HIGH (ref 3.5–5.1)
Sodium: 139 mEq/L (ref 135–145)
Total Bilirubin: 1 mg/dL (ref 0.2–1.2)
Total Protein: 7.4 g/dL (ref 6.0–8.3)

## 2022-02-21 LAB — HEMOGLOBIN A1C: Hgb A1c MFr Bld: 5.4 % (ref 4.6–6.5)

## 2022-02-21 LAB — CBC
HCT: 39.8 % (ref 36.0–46.0)
Hemoglobin: 13.3 g/dL (ref 12.0–15.0)
MCHC: 33.5 g/dL (ref 30.0–36.0)
MCV: 95.5 fl (ref 78.0–100.0)
Platelets: 213 10*3/uL (ref 150.0–400.0)
RBC: 4.16 Mil/uL (ref 3.87–5.11)
RDW: 13.1 % (ref 11.5–15.5)
WBC: 5 10*3/uL (ref 4.0–10.5)

## 2022-02-21 LAB — LIPID PANEL
Cholesterol: 211 mg/dL — ABNORMAL HIGH (ref 0–200)
HDL: 85.7 mg/dL (ref >39.00)
LDL Cholesterol: 111 mg/dL — ABNORMAL HIGH (ref 0–99)
NonHDL: 125.73
Total CHOL/HDL Ratio: 2
Triglycerides: 72 mg/dL (ref 0.0–149.0)
VLDL: 14.4 mg/dL (ref 0.0–40.0)

## 2022-02-21 LAB — TSH: TSH: 0.96 u[IU]/mL (ref 0.35–5.50)

## 2022-02-21 LAB — VITAMIN B12: Vitamin B-12: 258 pg/mL (ref 211–911)

## 2022-02-21 NOTE — Patient Instructions (Signed)
It was very nice to see you today!  Keep up the great work.  We will check blood work today.  I will see back in year for your next physical.  Come back sooner if needed.  Take care, Dr Jerline Pain  PLEASE NOTE:  If you had any lab tests please let us know if you have not heard back within a few days. You may see your results on mychart before we have a chance to review them but we will give you a call once they are reviewed by [place]. If we ordered any referrals today, please let us know if you have not heard from their office within the next week.   Please try these tips to maintain a healthy lifestyle:  Eat at least 3 REAL meals and 1-2 snacks per day.  Aim for no more than 5 hours between eating.  If you eat breakfast, please do so within one hour of getting up.   Each meal should contain half fruits/vegetables, one quarter protein, and one quarter carbs (no bigger than a computer mouse)  Cut down on sweet beverages. This includes juice, soda, and sweet tea.   Drink at least 1 glass of water with each meal and aim for at least 8 glasses per day  Exercise at least 150 minutes every week.    Preventive Care 24-59 Years Old, Female Preventive care refers to lifestyle choices and visits with your health care provider that can promote health and wellness. Preventive care visits are also called wellness exams. What can I expect for my preventive care visit? Counseling Your health care provider may ask you questions about your: Medical history, including: Past medical problems. Family medical history. Pregnancy history. Current health, including: Menstrual cycle. Method of birth control. Emotional well-being. Home life and relationship well-being. Sexual activity and sexual health. Lifestyle, including: Alcohol, nicotine or tobacco, and drug use. Access to firearms. Diet, exercise, and sleep habits. Work and work Statistician. Sunscreen use. Safety issues such as seatbelt and bike  helmet use. Physical exam Your health care provider will check your: Height and weight. These may be used to calculate your BMI (body mass index). BMI is a measurement that tells if you are at a healthy weight. Waist circumference. This measures the distance around your waistline. This measurement also tells if you are at a healthy weight and may help predict your risk of certain diseases, such as type 2 diabetes and high blood pressure. Heart rate and blood pressure. Body temperature. Skin for abnormal spots. What immunizations do I need?  Vaccines are usually given at various ages, according to a schedule. Your health care provider will recommend vaccines for you based on your age, medical history, and lifestyle or other factors, such as travel or where you work. What tests do I need? Screening Your health care provider may recommend screening tests for certain conditions. This may include: Lipid and cholesterol levels. Diabetes screening. This is done by checking your blood sugar (glucose) after you have not eaten for a while (fasting). Pelvic exam and Pap test. Hepatitis B test. Hepatitis C test. HIV (human immunodeficiency virus) test. STI (sexually transmitted infection) testing, if you are at risk. Lung cancer screening. Colorectal cancer screening. Mammogram. Talk with your health care provider about when you should start having regular mammograms. This may depend on whether you have a family history of breast cancer. BRCA-related cancer screening. This may be done if you have a family history of breast, ovarian, tubal, or peritoneal  cancers. Bone density scan. This is done to screen for osteoporosis. Talk with your health care provider about your test results, treatment options, and if necessary, the need for more tests. Follow these instructions at home: Eating and drinking  Eat a diet that includes fresh fruits and vegetables, whole grains, lean protein, and low-fat dairy  products. Take vitamin and mineral supplements as recommended by your health care provider. Do not drink alcohol if: Your health care provider tells you not to drink. You are pregnant, may be pregnant, or are planning to become pregnant. If you drink alcohol: Limit how much you have to 0-1 drink a day. Know how much alcohol is in your drink. In the [place] one drink equals one 12 oz bottle of beer (355 mL), one 5 oz glass of wine (148 mL), or one 1 oz glass of hard liquor (44 mL). Lifestyle Brush your teeth every morning and night with fluoride toothpaste. Floss one time each day. Exercise for at least 30 minutes 5 or more days each week. Do not use any products that contain nicotine or tobacco. These products include cigarettes, chewing tobacco, and vaping devices, such as e-cigarettes. If you need help quitting, ask your health care provider. Do not use drugs. If you are sexually active, practice safe sex. Use a condom or other form of protection to prevent STIs. If you do not wish to become pregnant, use a form of birth control. If you plan to become pregnant, see your health care provider for a prepregnancy visit. Take aspirin only as told by your health care provider. Make sure that you understand how much to take and what form to take. Work with your health care provider to find out whether it is safe and beneficial for you to take aspirin daily. Find healthy ways to manage stress, such as: Meditation, yoga, or listening to music. Journaling. Talking to a trusted person. Spending time with friends and family. Minimize exposure to UV radiation to reduce your risk of skin cancer. Safety Always wear your seat belt while driving or riding in a vehicle. Do not drive: If you have been drinking alcohol. Do not ride with someone who has been drinking. When you are tired or distracted. While texting. If you have been using any mind-altering substances or drugs. Wear a helmet and other  protective equipment during sports activities. If you have firearms in your house, make sure you follow all gun safety procedures. Seek help if you have been physically or sexually abused. What's next? Visit your health care provider once a year for an annual wellness visit. Ask your health care provider how often you should have your eyes and teeth checked. Stay up to date on all vaccines. This information is not intended to replace advice given to you by your health care provider. Make sure you discuss any questions you have with your health care provider. Document Revised: 11/28/2020 Document Reviewed: 11/28/2020 Elsevier Patient Education  [employer].

## 2022-02-21 NOTE — Assessment & Plan Note (Signed)
Doing well on gabapentin 300 mg nightly.

## 2022-02-21 NOTE — Progress Notes (Signed)
Chief Complaint:  Christine Lawrence is a 46 y.o. female who presents today for her annual comprehensive physical exam.    Assessment/Plan:  New/Acute Problems: Concern for Hair Loss Potentially hormone related.  Doubt this is a side effect of gabapentin as this is possible we will check labs today.  We will continue with watch waiting for now.  Eustachian tube dysfunction Recommended over-the-counter Flonase however she declined.  Also discussed auto insufflation.  She will let us know if not improving.  Chronic Problems Addressed Today: Dermatitis Stable on fluocinonide ointment as needed.  Paresthesias Doing well on gabapentin 300 mg nightly.  Preventative Healthcare: Check labs.  Up-to-date on Pap smear.  Due for colon cancer screening next year.  Patient Counseling(The following topics were reviewed and/or handout was given):  -Nutrition: Stressed importance of moderation in sodium/caffeine intake, saturated fat and cholesterol, caloric balance, sufficient intake of fresh fruits, vegetables, and fiber.  -Stressed the importance of regular exercise.   -Substance Abuse: Discussed cessation/primary prevention of tobacco, alcohol, or other drug use; driving or other dangerous activities under the influence; availability of treatment for abuse.   -Injury prevention: Discussed safety belts, safety helmets, smoke detector, smoking near bedding or upholstery.   -Sexuality: Discussed sexually transmitted diseases, partner selection, use of condoms, avoidance of unintended pregnancy and contraceptive alternatives.   -Dental health: Discussed importance of regular tooth brushing, flossing, and dental visits.  -Health maintenance and immunizations reviewed. Please refer to Health maintenance section.  Return to care in 1 year for next preventative visit.     Subjective:  HPI:  She has no acute complaints today.   She occasionally feels fullness in her ears.  She is also noticed more  her overall sleep recently.  She is concerned may be due to the gabapentin.  Lifestyle Diet: Balanced. Plenty of fruits and vegeteables.  Exercise: Does a lot of work outside.      02/21/2022    8:35 AM  Depression screen PHQ 2/9  Decreased Interest 0  Down, Depressed, Hopeless 0  PHQ - 2 Score 0    Health Maintenance Due  Topic Date Due   Hepatitis C Screening  Never done     ROS: Per HPI, otherwise a complete review of systems was negative.   PMH:  The following were reviewed and entered/updated in epic: Past Medical History:  Diagnosis Date   Generalized headaches    no recent headaches   Migraine    no recent migraines.   UTI (urinary tract infection)    Varicella 1982   Patient Active Problem List   Diagnosis Date Noted   Paresthesias 02/20/2021   Dermatitis 02/20/2021   Orthostatic hypotension 01/31/2021   Bilateral hand pain 08/18/2018   History reviewed. No pertinent surgical history.  Family History  Problem Relation Age of Onset   Hyperlipidemia Father    Hypertension Father    Heart disease Maternal Grandmother    Diabetes Maternal Grandmother    Arthritis Paternal Grandfather    Cancer Paternal Grandfather        prostate   Stroke Maternal Grandfather     Medications- reviewed and updated Current Outpatient Medications  Medication Sig Dispense Refill   fluocinonide ointment (LIDEX) 0.05 % APPLY ON THE SKIN TWICE DAILY 30 g 5   gabapentin (NEURONTIN) 300 MG capsule TAKE 1 CAPSULE(300 MG) BY MOUTH AT BEDTIME 90 capsule 3   No current facility-administered medications for this visit.    Allergies-reviewed and updated No Known Allergies  Social History   Socioeconomic History   Marital status: Married    Spouse name: Not on file   Number of children: 1   Years of education: 16   Highest education level: Not on file  Occupational History   Occupation: Mudlogger: [employer]    Comment: Investment banker, corporate  Tobacco Use   Smoking  status: Former    Packs/day: 0.50    Years: 10.00    Total pack years: 5.00    Types: Cigarettes    Quit date: 04/04/2009    Years since quitting: 12.8   Smokeless tobacco: Never  Vaping Use   Vaping Use: Never used  Substance and Sexual Activity   Alcohol use: Yes    Comment: occasional wine   Drug use: No   Sexual activity: Yes    Partners: Male  Other Topics Concern   Not on file  Social History Narrative   HS[employer], [employer] - [place] [employer]. Married '11. I dtr - '12. Work - Merchandiser, retail. Life is good.    Social Determinants of Health   Financial Resource Strain: Not on file  Food Insecurity: Not on file  Transportation Needs: Not on file  Physical Activity: Not on file  Stress: Not on file  Social Connections: Not on file        Objective:  Physical Exam: BP 96/66   Pulse 63   Temp 98.2 F (36.8 C) (Temporal)   Ht 5\' 7"  (1.702 m)   Wt 125 lb 12.8 oz (57.1 kg)   LMP 02/14/2022   SpO2 100%   BMI 19.70 kg/m   Body mass index is 19.7 kg/m. Wt Readings from Last 3 Encounters:  02/21/22 125 lb 12.8 oz (57.1 kg)  04/19/21 115 lb (52.2 kg)  02/20/21 125 lb (56.7 kg)   Gen: NAD, resting comfortably HEENT: TMs normal bilaterally. OP clear. No thyromegaly noted.  CV: RRR with no murmurs appreciated Pulm: NWOB, CTAB with no crackles, wheezes, or rhonchi GI: Normal bowel sounds present. Soft, Nontender, Nondistended. MSK: no edema, cyanosis, or clubbing noted Skin: warm, dry Neuro: CN2-12 grossly intact. Strength 5/5 in upper and lower extremities. Reflexes symmetric and intact bilaterally.  Psych: Normal affect and thought content     Christine Lawrence M. 04/22/21, MD 02/21/2022 8:56 AM

## 2022-02-21 NOTE — Assessment & Plan Note (Signed)
Stable on fluocinonide ointment as needed.

## 2022-02-27 NOTE — Progress Notes (Signed)
Please inform patient of the following:  Cholesterol is a little bit elevated.  She should continue to work on diet and exercise.  B12 is on the low side normal.  This could explain some of her symptoms.  Recommend she start a B12 supplement 1000 mcg daily.  We should recheck in 3 to 6 months.  Everything else is normal.  Can we check on the status for hepatitis C test?

## 2022-09-25 ENCOUNTER — Telehealth (INDEPENDENT_AMBULATORY_CARE_PROVIDER_SITE_OTHER): Payer: 59 | Admitting: Family Medicine

## 2022-09-25 VITALS — Ht 67.0 in | Wt 130.0 lb

## 2022-09-25 DIAGNOSIS — L309 Dermatitis, unspecified: Secondary | ICD-10-CM | POA: Diagnosis not present

## 2022-09-25 DIAGNOSIS — R202 Paresthesia of skin: Secondary | ICD-10-CM | POA: Diagnosis not present

## 2022-09-25 MED ORDER — GABAPENTIN 300 MG PO CAPS: ORAL_CAPSULE | ORAL | 3 refills | Status: DC

## 2022-09-25 MED ORDER — FLUOCINONIDE 0.05 % EX OINT
TOPICAL_OINTMENT | CUTANEOUS | 11 refills | Status: DC
Start: 2022-09-25 — End: 2023-03-11

## 2022-09-25 NOTE — Progress Notes (Signed)
   Christine Lawrence is a 47 y.o. female who presents today for a virtual office visit.  Assessment/Plan:  Chronic Problems Addressed Today: Paresthesias She is on gabapentin 300 mg nightly as needed.  She has been on this for several years and is doing well.  No significant side effects.  No somnolence.  Will refill today.  Dermatitis Stable on fluocinonide ointment as needed.  Uses a couple of times per month which works well.  Last refill was a couple of years ago.  Will refill today.     Subjective:  HPI:  See A/p for status of chronic conditions.  She is here today for medication refill. She is doing well and has no acute concerns.  She is on gabapentin and fluocinonide.  She tolerates both these well.  Last refill was a couple of years ago and the pharmacy told her that she needs an office visit with [place] for further refills.       Objective/Observations  Physical Exam: Gen: NAD, resting comfortably Pulm: Normal work of breathing Neuro: Grossly normal, moves all extremities Psych: Normal affect and thought content  Virtual Visit via Video   I connected with Valentino Hue on 09/25/22 at 11:40 AM EDT by a video enabled telemedicine application and verified that I am speaking with the correct person using two identifiers. The limitations of evaluation and management by telemedicine and the availability of in person appointments were discussed. The patient expressed understanding and agreed to proceed.   Patient location: Home Provider location: [place] [facility] [employer] [facility] Persons participating in the virtual visit: Myself and Patient     [name]. Jimmey Ralph, MD 09/25/2022 12:11 PM

## 2022-09-25 NOTE — Assessment & Plan Note (Signed)
She is on gabapentin 300 mg nightly as needed.  She has been on this for several years and is doing well.  No significant side effects.  No somnolence.  Will refill today.

## 2022-09-25 NOTE — Assessment & Plan Note (Signed)
Stable on fluocinonide ointment as needed.  Uses a couple of times per month which works well.  Last refill was a couple of years ago.  Will refill today.

## 2022-12-03 DIAGNOSIS — S0191XA Laceration without foreign body of unspecified part of head, initial encounter: Secondary | ICD-10-CM | POA: Diagnosis not present

## 2022-12-03 DIAGNOSIS — Z23 Encounter for immunization: Secondary | ICD-10-CM | POA: Diagnosis not present

## 2022-12-03 DIAGNOSIS — W5512XA Struck by horse, initial encounter: Secondary | ICD-10-CM | POA: Diagnosis not present

## 2022-12-03 DIAGNOSIS — S0101XA Laceration without foreign body of scalp, initial encounter: Secondary | ICD-10-CM | POA: Diagnosis not present

## 2022-12-03 DIAGNOSIS — S199XXA Unspecified injury of neck, initial encounter: Secondary | ICD-10-CM | POA: Diagnosis not present

## 2022-12-03 DIAGNOSIS — S0990XA Unspecified injury of head, initial encounter: Secondary | ICD-10-CM | POA: Diagnosis not present

## 2022-12-12 ENCOUNTER — Ambulatory Visit (INDEPENDENT_AMBULATORY_CARE_PROVIDER_SITE_OTHER): Payer: 59 | Admitting: Family Medicine

## 2022-12-12 ENCOUNTER — Encounter: Payer: Self-pay | Admitting: Family Medicine

## 2022-12-12 VITALS — BP 100/60 | HR 68 | Temp 98.0°F | Ht 67.0 in | Wt 130.4 lb

## 2022-12-12 DIAGNOSIS — R200 Anesthesia of skin: Secondary | ICD-10-CM

## 2022-12-12 DIAGNOSIS — S0990XD Unspecified injury of head, subsequent encounter: Secondary | ICD-10-CM

## 2022-12-12 NOTE — Patient Instructions (Addendum)
It was very nice to see you today!  We removed your staples today.   Please let us know if the numbness in your face does not improve.  Return if symptoms worsen or fail to improve.   Take care, Dr Jimmey Ralph  PLEASE NOTE:  If you had any lab tests, please let us know if you have not heard back within a few days. You may see your results on mychart before we have a chance to review them but we will give you a call once they are reviewed by [place].   If we ordered any referrals today, please let us know if you have not heard from their office within the next week.   If you had any urgent prescriptions sent in today, please check with the pharmacy within an hour of our visit to make sure the prescription was transmitted appropriately.   Please try these tips to maintain a healthy lifestyle:  Eat at least 3 REAL meals and 1-2 snacks per day.  Aim for no more than 5 hours between eating.  If you eat breakfast, please do so within one hour of getting up.   Each meal should contain half fruits/vegetables, one quarter protein, and one quarter carbs (no bigger than a computer mouse)  Cut down on sweet beverages. This includes juice, soda, and sweet tea.   Drink at least 1 glass of water with each meal and aim for at least 8 glasses per day  Exercise at least 150 minutes every week.

## 2022-12-12 NOTE — Progress Notes (Signed)
   Christine Lawrence is a 47 y.o. female who presents today for an office visit.  Assessment/Plan:  Head Injury She is not currently having any postconcussive symptoms.  Her staples are removed today as below without complication.  She has a reassuring neurologic exam today as well.  Imaging in the ED was negative.  Anticipate she will not have any issues going forward though we did discuss reasons to return to care such as development of headache, nausea, vomiting, cognitive issues, etc.  Facial numbness  Patient with some paresthesias along left V2 distribution.  She did have her face strike against a wall during the accident and likely had a mild contusion to the area.  Given her imaging in the Emergency Department was negative and the fact that she has had improvement the last few days do not think we need to repeat any imaging at this point.  She can continue taking ibuprofen or other anti-inflammatories as needed and hopefully this should continue to improve. She will let us know if not improved in the next 1 to 2 weeks and we can refer to neurology or obtain further imaging at that time.     Subjective:  HPI:  See A/P for status of chronic conditions.  Patient is here today for suture removal.  Patient went to the ED a week and a half ago after being kicked in her head by a horse.  Patient started that she was removing a boot from one of her horses. The horse got spooked and kicked her in the back of the head.  This propelled her into an adjacent wall worksheet she struck with the left side of her face.  She believes that she lost consciousness for about 5 minutes or so until some of her coworkers found her.  She was subsequently taken to the emergency room.  Imaging that was negative for fracture.  Staples were placed on the laceration on the vertex of her scalp and she was discharged home.  She has done relatively well since being discharged.  No lingering headache.  No nausea or vomiting.  No  vision changes.  She is having some lingering numbness on the left side of her face near her cheek and the left side of her nose.  Sometimes feels like a tingling sensation and sometimes feels elaboration sensation.  She also notices that if she presses on her left eye this can sometimes make the sensations worse.  This does seem to be improving over the last couple of days.       Objective:  Physical Exam: There were no vitals taken for this visit.  Gen: No acute distress, resting comfortably Neuro: Decreased sensation to light touch in V2 distribution on left face otherwise cranial nerves II through XII intact. Skin: Well-healed laceration on vertex of scalp with 2 staples in place. Psych: Normal affect and thought content  Procedure note Verbal consent obtained.  The 2 above staples were identified and removed using staple extractor.  She tolerated well.  No complications.  Time Spent: 30 minutes of total time was spent on the date of the encounter performing the following actions: chart review prior to seeing the patient including recent Emergency Department visit, obtaining history, performing a medically necessary exam, counseling on the treatment plan, placing orders, and documenting in our EHR.        [name]. Jimmey Ralph, MD 12/12/2022 9:55 AM

## 2023-01-29 ENCOUNTER — Encounter (INDEPENDENT_AMBULATORY_CARE_PROVIDER_SITE_OTHER): Payer: Self-pay

## 2023-03-11 ENCOUNTER — Encounter: Payer: Self-pay | Admitting: Family Medicine

## 2023-03-11 ENCOUNTER — Ambulatory Visit: Payer: 59 | Admitting: Family Medicine

## 2023-03-11 VITALS — BP 102/67 | HR 57 | Temp 97.3°F | Ht 67.0 in | Wt 135.0 lb

## 2023-03-11 DIAGNOSIS — R739 Hyperglycemia, unspecified: Secondary | ICD-10-CM | POA: Diagnosis not present

## 2023-03-11 DIAGNOSIS — Z0001 Encounter for general adult medical examination with abnormal findings: Secondary | ICD-10-CM | POA: Diagnosis not present

## 2023-03-11 DIAGNOSIS — R202 Paresthesia of skin: Secondary | ICD-10-CM

## 2023-03-11 DIAGNOSIS — L309 Dermatitis, unspecified: Secondary | ICD-10-CM | POA: Diagnosis not present

## 2023-03-11 DIAGNOSIS — Z0000 Encounter for general adult medical examination without abnormal findings: Secondary | ICD-10-CM | POA: Diagnosis not present

## 2023-03-11 DIAGNOSIS — Z23 Encounter for immunization: Secondary | ICD-10-CM | POA: Diagnosis not present

## 2023-03-11 DIAGNOSIS — E785 Hyperlipidemia, unspecified: Secondary | ICD-10-CM | POA: Diagnosis not present

## 2023-03-11 LAB — COMPREHENSIVE METABOLIC PANEL
ALT: 15 U/L (ref 0–35)
AST: 18 U/L (ref 0–37)
Albumin: 4.3 g/dL (ref 3.5–5.2)
Alkaline Phosphatase: 54 U/L (ref 39–117)
BUN: 10 mg/dL (ref 6–23)
CO2: 30 mEq/L (ref 19–32)
Calcium: 9.2 mg/dL (ref 8.4–10.5)
Chloride: 103 mEq/L (ref 96–112)
Creatinine, Ser: 0.68 mg/dL (ref 0.40–1.20)
GFR: 104.9 mL/min (ref >60.00)
Glucose, Bld: 85 mg/dL (ref 70–99)
Potassium: 4 mEq/L (ref 3.5–5.1)
Sodium: 140 mEq/L (ref 135–145)
Total Bilirubin: 1.2 mg/dL (ref 0.2–1.2)
Total Protein: 6.8 g/dL (ref 6.0–8.3)

## 2023-03-11 LAB — LIPID PANEL
Cholesterol: 199 mg/dL (ref 0–200)
HDL: 77.6 mg/dL (ref >39.00)
LDL Cholesterol: 112 mg/dL — ABNORMAL HIGH (ref 0–99)
NonHDL: 121.38
Total CHOL/HDL Ratio: 3
Triglycerides: 47 mg/dL (ref 0.0–149.0)
VLDL: 9.4 mg/dL (ref 0.0–40.0)

## 2023-03-11 LAB — CBC
HCT: 37.4 % (ref 36.0–46.0)
Hemoglobin: 12.6 g/dL (ref 12.0–15.0)
MCHC: 33.7 g/dL (ref 30.0–36.0)
MCV: 93 fl (ref 78.0–100.0)
Platelets: 209 10*3/uL (ref 150.0–400.0)
RBC: 4.02 Mil/uL (ref 3.87–5.11)
RDW: 12.7 % (ref 11.5–15.5)
WBC: 4.9 10*3/uL (ref 4.0–10.5)

## 2023-03-11 LAB — HEMOGLOBIN A1C: Hgb A1c MFr Bld: 5.3 % (ref 4.6–6.5)

## 2023-03-11 LAB — TSH: TSH: 1.09 u[IU]/mL (ref 0.35–5.50)

## 2023-03-11 MED ORDER — GABAPENTIN 300 MG PO CAPS: ORAL_CAPSULE | ORAL | 3 refills | Status: DC

## 2023-03-11 MED ORDER — FLUOCINONIDE 0.05 % EX OINT
TOPICAL_OINTMENT | CUTANEOUS | 11 refills | Status: DC
Start: 2023-03-11 — End: 2024-03-16

## 2023-03-11 NOTE — Progress Notes (Signed)
Chief Complaint:  Christine Lawrence is a 47 y.o. female who presents today for her annual comprehensive physical exam.    Assessment/Plan:  Chronic Problems Addressed Today: Paresthesias On gabapentin 3 to milligrams nightly as needed.  Will refill today.  Doing well.  No side effects.  Dermatitis Stable on fluocinonide ointment twice daily as needed.  Will refill today.  Preventative Healthcare: Flu shot given today.  Check labs.  Order Cologuard.  She will follow-up with GYN soon for Pap and breast cancer screening.  Patient Counseling(The following topics were reviewed and/or handout was given):  -Nutrition: Stressed importance of moderation in sodium/caffeine intake, saturated fat and cholesterol, caloric balance, sufficient intake of fresh fruits, vegetables, and fiber.  -Stressed the importance of regular exercise.   -Substance Abuse: Discussed cessation/primary prevention of tobacco, alcohol, or other drug use; driving or other dangerous activities under the influence; availability of treatment for abuse.   -Injury prevention: Discussed safety belts, safety helmets, smoke detector, smoking near bedding or upholstery.   -Sexuality: Discussed sexually transmitted diseases, partner selection, use of condoms, avoidance of unintended pregnancy and contraceptive alternatives.   -Dental health: Discussed importance of regular tooth brushing, flossing, and dental visits.  -Health maintenance and immunizations reviewed. Please refer to Health maintenance section.  Return to care in 1 year for next preventative visit.     Subjective:  HPI:  She has no acute complaints today.   Lifestyle Diet: Balanced. Plenty of fruits and vegetables.  Exercise: None specific. She stays active with work.      03/11/2023    8:47 AM  Depression screen PHQ 2/9  Decreased Interest 0  Down, Depressed, Hopeless 0  PHQ - 2 Score 0    Health Maintenance Due  Topic Date Due   Hepatitis C Screening   Never done   Fecal DNA (Cologuard)  Never done     ROS: Per HPI, otherwise a complete review of systems was negative.   PMH:  The following were reviewed and entered/updated in epic: Past Medical History:  Diagnosis Date   Generalized headaches    no recent headaches   Migraine    no recent migraines.   UTI (urinary tract infection)    Varicella 1982   Patient Active Problem List   Diagnosis Date Noted   Paresthesias 02/20/2021   Dermatitis 02/20/2021   Orthostatic hypotension 01/31/2021   Bilateral hand pain 08/18/2018   History reviewed. No pertinent surgical history.  Family History  Problem Relation Age of Onset   Hyperlipidemia Father    Hypertension Father    Heart disease Maternal Grandmother    Diabetes Maternal Grandmother    Arthritis Paternal Grandfather    Cancer Paternal Grandfather        prostate   Stroke Maternal Grandfather     Medications- reviewed and updated Current Outpatient Medications  Medication Sig Dispense Refill   fluocinonide ointment (LIDEX) 0.05 % APPLY ON THE SKIN TWICE DAILY 30 g 11   gabapentin (NEURONTIN) 300 MG capsule TAKE 1 CAPSULE(300 MG) BY MOUTH AT BEDTIME 90 capsule 3   No current facility-administered medications for this visit.    Allergies-reviewed and updated No Known Allergies  Social History   Socioeconomic History   Marital status: Married    Spouse name: Not on file   Number of children: 1   Years of education: 16   Highest education level: Not on file  Occupational History   Occupation: Mudlogger: [employer]  Comment: [employer]  Tobacco Use   Smoking status: Former    Current packs/day: 0.00    Average packs/day: 0.5 packs/day for 10.0 years (5.0 ttl pk-yrs)    Types: Cigarettes    Start date: 04/05/1999    Quit date: 04/04/2009    Years since quitting: 13.9   Smokeless tobacco: Never  Vaping Use   Vaping status: Never Used  Substance and Sexual Activity   Alcohol use: Yes     Comment: occasional wine   Drug use: No   Sexual activity: Yes    Partners: Male  Other Topics Concern   Not on file  Social History Narrative   HSG, [employer] - [place] [employer]. Married '11. I dtr - '12. Work - Merchandiser, retail. Life is good.    Social Determinants of Health   Financial Resource Strain: Not on file  Food Insecurity: Not on file  Transportation Needs: Not on file  Physical Activity: Not on file  Stress: Not on file  Social Connections: Unknown (12/03/2022)   [facility]   Social Network    Social Network: Not on file        Objective:  Physical Exam: BP 102/67   Pulse (!) 57   Temp (!) 97.3 F (36.3 C) (Temporal)   Ht 5\' 7"  (1.702 m)   Wt 135 lb (61.2 kg)   LMP 02/27/2023   SpO2 98%   BMI 21.14 kg/m   Body mass index is 21.14 kg/m. Wt Readings from Last 3 Encounters:  03/11/23 135 lb (61.2 kg)  12/12/22 130 lb 6.1 oz (59.1 kg)  09/25/22 130 lb (59 kg)   Gen: NAD, resting comfortably HEENT: TMs normal bilaterally. OP clear. No thyromegaly noted.  CV: RRR with no murmurs appreciated Pulm: NWOB, CTAB with no crackles, wheezes, or rhonchi GI: Normal bowel sounds present. Soft, Nontender, Nondistended. MSK: no edema, cyanosis, or clubbing noted Skin: warm, dry Neuro: CN2-12 grossly intact. Strength 5/5 in upper and lower extremities. Reflexes symmetric and intact bilaterally.  Psych: Normal affect and thought content     Jamerica Snavely M. Jimmey Ralph, MD 03/11/2023 9:12 AM

## 2023-03-11 NOTE — Assessment & Plan Note (Signed)
Stable on fluocinonide ointment twice daily as needed.  Will refill today.

## 2023-03-11 NOTE — Assessment & Plan Note (Signed)
On gabapentin 3 to milligrams nightly as needed.  Will refill today.  Doing well.  No side effects.

## 2023-03-11 NOTE — Patient Instructions (Signed)
It was very nice to see you today!  We will check blood work today.  Will also order Cologuard.  Please continue to work on diet and exercise.  I will refill your medications today.   Return in about 1 year (around 03/10/2024) for Annual Physical.   Take care, Dr Jimmey Ralph  PLEASE NOTE:  If you had any lab tests, please let us know if you have not heard back within a few days. You may see your results on mychart before we have a chance to review them but we will give you a call once they are reviewed by [place].   If we ordered any referrals today, please let us know if you have not heard from their office within the next week.   If you had any urgent prescriptions sent in today, please check with the pharmacy within an hour of our visit to make sure the prescription was transmitted appropriately.   Please try these tips to maintain a healthy lifestyle:  Eat at least 3 REAL meals and 1-2 snacks per day.  Aim for no more than 5 hours between eating.  If you eat breakfast, please do so within one hour of getting up.   Each meal should contain half fruits/vegetables, one quarter protein, and one quarter carbs (no bigger than a computer mouse)  Cut down on sweet beverages. This includes juice, soda, and sweet tea.   Drink at least 1 glass of water with each meal and aim for at least 8 glasses per day  Exercise at least 150 minutes every week.    Preventive Care 50-82 Years Old, Female Preventive care refers to lifestyle choices and visits with your health care provider that can promote health and wellness. Preventive care visits are also called wellness exams. What can I expect for my preventive care visit? Counseling Your health care provider may ask you questions about your: Medical history, including: Past medical problems. Family medical history. Pregnancy history. Current health, including: Menstrual cycle. Method of birth control. Emotional well-being. Home life and  relationship well-being. Sexual activity and sexual health. Lifestyle, including: Alcohol, nicotine or tobacco, and drug use. Access to firearms. Diet, exercise, and sleep habits. Work and work Astronomer. Sunscreen use. Safety issues such as seatbelt and bike helmet use. Physical exam Your health care provider will check your: Height and weight. These may be used to calculate your BMI (body mass index). BMI is a measurement that tells if you are at a healthy weight. Waist circumference. This measures the distance around your waistline. This measurement also tells if you are at a healthy weight and may help predict your risk of certain diseases, such as type 2 diabetes and high blood pressure. Heart rate and blood pressure. Body temperature. Skin for abnormal spots. What immunizations do I need?  Vaccines are usually given at various ages, according to a schedule. Your health care provider will recommend vaccines for you based on your age, medical history, and lifestyle or other factors, such as travel or where you work. What tests do I need? Screening Your health care provider may recommend screening tests for certain conditions. This may include: Lipid and cholesterol levels. Diabetes screening. This is done by checking your blood sugar (glucose) after you have not eaten for a while (fasting). Pelvic exam and Pap test. Hepatitis B test. Hepatitis C test. HIV (human immunodeficiency virus) test. STI (sexually transmitted infection) testing, if you are at risk. Lung cancer screening. Colorectal cancer screening. Mammogram. Talk with your  health care provider about when you should start having regular mammograms. This may depend on whether you have a family history of breast cancer. BRCA-related cancer screening. This may be done if you have a family history of breast, ovarian, tubal, or peritoneal cancers. Bone density scan. This is done to screen for osteoporosis. Talk with your  health care provider about your test results, treatment options, and if necessary, the need for more tests. Follow these instructions at home: Eating and drinking  Eat a diet that includes fresh fruits and vegetables, whole grains, lean protein, and low-fat dairy products. Take vitamin and mineral supplements as recommended by your health care provider. Do not drink alcohol if: Your health care provider tells you not to drink. You are pregnant, may be pregnant, or are planning to become pregnant. If you drink alcohol: Limit how much you have to 0-1 drink a day. Know how much alcohol is in your drink. In the [place] one drink equals one 12 oz bottle of beer (355 mL), one 5 oz glass of wine (148 mL), or one 1 oz glass of hard liquor (44 mL). Lifestyle Brush your teeth every morning and night with fluoride toothpaste. Floss one time each day. Exercise for at least 30 minutes 5 or more days each week. Do not use any products that contain nicotine or tobacco. These products include cigarettes, chewing tobacco, and vaping devices, such as e-cigarettes. If you need help quitting, ask your health care provider. Do not use drugs. If you are sexually active, practice safe sex. Use a condom or other form of protection to prevent STIs. If you do not wish to become pregnant, use a form of birth control. If you plan to become pregnant, see your health care provider for a prepregnancy visit. Take aspirin only as told by your health care provider. Make sure that you understand how much to take and what form to take. Work with your health care provider to find out whether it is safe and beneficial for you to take aspirin daily. Find healthy ways to manage stress, such as: Meditation, yoga, or listening to music. Journaling. Talking to a trusted person. Spending time with friends and family. Minimize exposure to UV radiation to reduce your risk of skin cancer. Safety Always wear your seat belt while driving  or riding in a vehicle. Do not drive: If you have been drinking alcohol. Do not ride with someone who has been drinking. When you are tired or distracted. While texting. If you have been using any mind-altering substances or drugs. Wear a helmet and other protective equipment during sports activities. If you have firearms in your house, make sure you follow all gun safety procedures. Seek help if you have been physically or sexually abused. What's next? Visit your health care provider once a year for an annual wellness visit. Ask your health care provider how often you should have your eyes and teeth checked. Stay up to date on all vaccines. This information is not intended to replace advice given to you by your health care provider. Make sure you discuss any questions you have with your health care provider. Document Revised: 11/28/2020 Document Reviewed: 11/28/2020 Elsevier Patient Education  2024 [employer].

## 2023-03-16 NOTE — Progress Notes (Signed)
Her labs are all stable.  She should continue to work on diet and exercise and we can recheck everything in a year.

## 2024-03-16 ENCOUNTER — Ambulatory Visit: Admitting: Family Medicine

## 2024-03-16 ENCOUNTER — Encounter: Payer: 59 | Admitting: Family Medicine

## 2024-03-16 ENCOUNTER — Encounter: Payer: Self-pay | Admitting: Family Medicine

## 2024-03-16 VITALS — BP 93/64 | HR 65 | Temp 98.1°F | Ht 67.0 in | Wt 134.4 lb

## 2024-03-16 DIAGNOSIS — Z1211 Encounter for screening for malignant neoplasm of colon: Secondary | ICD-10-CM

## 2024-03-16 DIAGNOSIS — L309 Dermatitis, unspecified: Secondary | ICD-10-CM

## 2024-03-16 DIAGNOSIS — Z23 Encounter for immunization: Secondary | ICD-10-CM

## 2024-03-16 DIAGNOSIS — R202 Paresthesia of skin: Secondary | ICD-10-CM | POA: Diagnosis not present

## 2024-03-16 DIAGNOSIS — Z0001 Encounter for general adult medical examination with abnormal findings: Secondary | ICD-10-CM

## 2024-03-16 MED ORDER — GABAPENTIN 300 MG PO CAPS: ORAL_CAPSULE | ORAL | 3 refills | Status: DC

## 2024-03-16 MED ORDER — FLUOCINONIDE 0.05 % EX OINT: TOPICAL_OINTMENT | CUTANEOUS | 11 refills | Status: AC

## 2024-03-16 NOTE — Assessment & Plan Note (Signed)
 Stable on fluocinolone ointment twice daily as needed.  Will refill today.

## 2024-03-16 NOTE — Progress Notes (Signed)
 Chief Complaint:  Christine Lawrence is a 48 y.o. female who presents today for her annual comprehensive physical exam.    Assessment/Plan:  Chronic Problems Addressed Today: Paresthesias Stable on gabapentin  300 mg nightly.  Will refill today.  Dermatitis Stable on fluocinolone ointment twice daily as needed.  Will refill today.   Preventative Healthcare: Flu vaccine given today. Order Cologuard.  Follows with gynecology for woman's health and will be getting a Pap and mammogram later this year.  Declined labs for today.  Patient Counseling(The following topics were reviewed and/or handout was given):  -Nutrition: Stressed importance of moderation in sodium/caffeine intake, saturated fat and cholesterol, caloric balance, sufficient intake of fresh fruits, vegetables, and fiber.  -Stressed the importance of regular exercise.   -Substance Abuse: Discussed cessation/primary prevention of tobacco, alcohol, or other drug use; driving or other dangerous activities under the influence; availability of treatment for abuse.   -Injury prevention: Discussed safety belts, safety helmets, smoke detector, smoking near bedding or upholstery.   -Sexuality: Discussed sexually transmitted diseases, partner selection, use of condoms, avoidance of unintended pregnancy and contraceptive alternatives.   -Dental health: Discussed importance of regular tooth brushing, flossing, and dental visits.  -Health maintenance and immunizations reviewed. Please refer to Health maintenance section.  Return to care in 1 year for next preventative visit.     Subjective:  HPI:  She has no acute complaints today. Patient is here today for her  annual physical.  See assessment / plan for status of chronic conditions.  Discussed the use of AI scribe software for clinical note transcription with the patient, who gave verbal consent to proceed.  History of Present Illness Christine Lawrence is a 48 year old female who  presents for an annual physical exam.  The past year has been uneventful with no significant health issues. She maintains a consistent diet and exercise routine, ensuring adequate physical activity and a diet rich in fruits and vegetables.  She is scheduled for a gynecological appointment in November for her Pap smear and mammogram. She has not received the Cologuard box for colon cancer screening that was discussed last year.  She declines to have blood work done today, preferring to postpone it until the next visit.  She notes a decline in her vision, particularly with up-close tasks, and has not had an eye exam in a long time.     Lifestyle Diet: Balanced. Plenty of fruits and vegetables.  Exercise: Very active with work.      03/16/2024    7:28 AM  Depression screen PHQ 2/9  Decreased Interest 0  Down, Depressed, Hopeless 0  PHQ - 2 Score 0    Health Maintenance Due  Topic Date Due   Hepatitis B Vaccines 19-59 Average Risk (1 of 3 - 19+ 3-dose series) Never done   Mammogram  06/14/2021   Fecal DNA (Cologuard)  Never done   Influenza Vaccine  01/15/2024   Cervical Cancer Screening (HPV/Pap Cotest)  04/17/2024     ROS: Per HPI, otherwise a complete review of systems was negative.   PMH:  The following were reviewed and entered/updated in epic: Past Medical History:  Diagnosis Date   Generalized headaches    no recent headaches   Migraine    no recent migraines.   UTI (urinary tract infection)    Varicella 1982   Patient Active Problem List   Diagnosis Date Noted   Paresthesias 02/20/2021   Dermatitis 02/20/2021   Orthostatic hypotension 01/31/2021  Bilateral hand pain 08/18/2018   History reviewed. No pertinent surgical history.  Family History  Problem Relation Age of Onset   Hyperlipidemia Father    Hypertension Father    Heart disease Maternal Grandmother    Diabetes Maternal Grandmother    Arthritis Paternal Grandfather    Cancer Paternal  Grandfather        prostate   Stroke Maternal Grandfather     Medications- reviewed and updated Current Outpatient Medications  Medication Sig Dispense Refill   fluocinonide  ointment (LIDEX ) 0.05 % APPLY ON THE SKIN TWICE DAILY 30 g 11   gabapentin  (NEURONTIN ) 300 MG capsule TAKE 1 CAPSULE(300 MG) BY MOUTH AT BEDTIME 90 capsule 3   No current facility-administered medications for this visit.    Allergies-reviewed and updated No Known Allergies  Social History   Socioeconomic History   Marital status: Married    Spouse name: Not on file   Number of children: 1   Years of education: 16   Highest education level: Bachelor's degree (e.g., BA, AB, BS)  Occupational History   Occupation: Mudlogger: [employer]    Comment: Investment banker, corporate  Tobacco Use   Smoking status: Former    Current packs/day: 0.00    Average packs/day: 0.5 packs/day for 10.0 years (5.0 ttl pk-yrs)    Types: Cigarettes    Start date: 04/05/1999    Quit date: 04/04/2009    Years since quitting: 14.9   Smokeless tobacco: Never  Vaping Use   Vaping status: Never Used  Substance and Sexual Activity   Alcohol use: Yes    Comment: occasional wine   Drug use: No   Sexual activity: Yes    Partners: Male  Other Topics Concern   Not on file  Social History Narrative   HSG, Univ - [place] [employer]. Married '11. I dtr - '12. Work - Merchandiser, retail. Life is good.    Social Drivers of Corporate investment banker Strain: Low Risk  (03/15/2024)   Overall Financial Resource Strain (CARDIA)    Difficulty of Paying Living Expenses: Not very hard  Food Insecurity: No Food Insecurity (03/15/2024)   Hunger Vital Sign    Worried About Running Out of Food in the Last Year: Never true    Ran Out of Food in the Last Year: Never true  Transportation Needs: No Transportation Needs (03/15/2024)   PRAPARE - Administrator, Civil Service (Medical): No    Lack of Transportation  (Non-Medical): No  Physical Activity: Inactive (03/15/2024)   Exercise Vital Sign    Days of Exercise per Week: 0 days    Minutes of Exercise per Session: Not on file  Stress: No Stress Concern Present (03/15/2024)   [employer] of Occupational Health - Occupational Stress Questionnaire    Feeling of Stress: Not at all  Social Connections: Socially Isolated (03/15/2024)   Social Connection and Isolation Panel    Frequency of Communication with Friends and Family: Once a week    Frequency of Social Gatherings with Friends and Family: Once a week    Attends Religious Services: Never    Database administrator or Organizations: No    Attends Engineer, structural: Not on file    Marital Status: Married        Objective:  Physical Exam: BP 93/64   Pulse 65   Temp 98.1 F (36.7 C) (Temporal)   Ht 5' 7 (1.702 m)   Wt 134  lb 6.4 oz (61 kg)   SpO2 99%   BMI 21.05 kg/m   Body mass index is 21.05 kg/m. Wt Readings from Last 3 Encounters:  03/16/24 134 lb 6.4 oz (61 kg)  03/11/23 135 lb (61.2 kg)  12/12/22 130 lb 6.1 oz (59.1 kg)   Gen: NAD, resting comfortably HEENT: TMs normal bilaterally. OP clear. No thyromegaly noted.  CV: RRR with no murmurs appreciated Pulm: NWOB, CTAB with no crackles, wheezes, or rhonchi GI: Normal bowel sounds present. Soft, Nontender, Nondistended. MSK: no edema, cyanosis, or clubbing noted Skin: warm, dry Neuro: CN2-12 grossly intact. Strength 5/5 in upper and lower extremities. Reflexes symmetric and intact bilaterally.  Psych: Normal affect and thought content     Toni Hoffmeister M. Kennyth, MD 03/16/2024 7:43 AM

## 2024-03-16 NOTE — Assessment & Plan Note (Signed)
 Stable on gabapentin  300 mg nightly.  Will refill today.

## 2024-03-16 NOTE — Patient Instructions (Signed)
 It was very nice to see you today!  VISIT SUMMARY: Today, you had your annual physical exam. There were no new health concerns, and you are maintaining a healthy lifestyle. We discussed your vision changes and your current pain management plan.  YOUR PLAN: ADULT WELLNESS VISIT: Your annual wellness visit showed no new concerns or significant changes since last year. -We will defer blood work to your next visit as your previous results were satisfactory. -You will receive a flu vaccine today. -We will send you a Cologuard kit for colon cancer screening.  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 -Consider using over-the-counter reading glasses. -You can also use a phone magnifying app to help with reading small print.  Return in about 1 year (around 03/16/2025) for Annual Physical.   Take care, Dr Kennyth  PLEASE NOTE:  If you had any lab tests, please let us  know if you have not heard back within a few days. You may see your results on mychart before we have a chance to review them but we will give you a call once they are reviewed by us .   If we ordered any referrals today, please let us  know if you have not heard from their office within the next week.   If you had any urgent prescriptions sent in today, please check with the pharmacy within an hour of our visit to make sure the prescription was transmitted appropriately.   Please try these tips to maintain a healthy lifestyle:  Eat at least 3 REAL meals and 1-2 snacks per day.  Aim for no more than 5 hours between eating.  If you eat breakfast, please do so within one hour of getting up.   Each meal should contain half fruits/vegetables, one quarter protein, and one quarter carbs (no bigger than a computer mouse)  Cut down on sweet beverages. This includes juice, soda, and sweet tea.   Drink at least 1 glass of water with each meal and aim for at least  8 glasses per day  Exercise at least 150 minutes every week.     Preventive Care 27-10 Years Old, Female Preventive care refers to lifestyle choices and visits with your health care provider that can promote health and wellness. Preventive care visits are also called wellness exams. What can I expect for my preventive care visit? Counseling Your health care provider may ask you questions about your: Medical history, including: Past medical problems. Family medical history. Pregnancy history. Current health, including: Menstrual cycle. Method of birth control. Emotional well-being. Home life and relationship well-being. Sexual activity and sexual health. Lifestyle, including: Alcohol, nicotine or tobacco, and drug use. Access to firearms. Diet, exercise, and sleep habits. Work and work Astronomer. Sunscreen use. Safety issues such as seatbelt and bike helmet use. Physical exam Your health care provider will check your: Height and weight. These may be used to calculate your BMI (body mass index). BMI is a measurement that tells if you are at a healthy weight. Waist circumference. This measures the distance around your waistline. This measurement also tells if you are at a healthy weight and may help predict your risk of certain diseases, such as type 2 diabetes and high blood pressure. Heart rate and blood pressure. Body temperature. Skin for abnormal spots. What immunizations do I need?  Vaccines are usually given at various ages, according to a schedule. Your health care provider will recommend vaccines for you based  on your age, medical history, and lifestyle or other factors, such as travel or where you work. What tests do I need? Screening Your health care provider may recommend screening tests for certain conditions. This may include: Lipid and cholesterol levels. Diabetes screening. This is done by checking your blood sugar (glucose) after you have not eaten for a while  (fasting). Pelvic exam and Pap test. Hepatitis B test. Hepatitis C test. HIV (human immunodeficiency virus) test. STI (sexually transmitted infection) testing, if you are at risk. Lung cancer screening. Colorectal cancer screening. Mammogram. Talk with your health care provider about when you should start having regular mammograms. This may depend on whether you have a family history of breast cancer. BRCA-related cancer screening. This may be done if you have a family history of breast, ovarian, tubal, or peritoneal cancers. Bone density scan. This is done to screen for osteoporosis. Talk with your health care provider about your test results, treatment options, and if necessary, the need for more tests. Follow these instructions at home: Eating and drinking  Eat a diet that includes fresh fruits and vegetables, whole grains, lean protein, and low-fat dairy products. Take vitamin and mineral supplements as recommended by your health care provider. Do not drink alcohol if: Your health care provider tells you not to drink. You are pregnant, may be pregnant, or are planning to become pregnant. If you drink alcohol: Limit how much you have to 0-1 drink a day. Know how much alcohol is in your drink. In the [place] one drink equals one 12 oz bottle of beer (355 mL), one 5 oz glass of wine (148 mL), or one 1 oz glass of hard liquor (44 mL). Lifestyle Brush your teeth every morning and night with fluoride toothpaste. Floss one time each day. Exercise for at least 30 minutes 5 or more days each week. Do not use any products that contain nicotine or tobacco. These products include cigarettes, chewing tobacco, and vaping devices, such as e-cigarettes. If you need help quitting, ask your health care provider. Do not use drugs. If you are sexually active, practice safe sex. Use a condom or other form of protection to prevent STIs. If you do not wish to become pregnant, use a form of birth control. If  you plan to become pregnant, see your health care provider for a prepregnancy visit. Take aspirin only as told by your health care provider. Make sure that you understand how much to take and what form to take. Work with your health care provider to find out whether it is safe and beneficial for you to take aspirin daily. Find healthy ways to manage stress, such as: Meditation, yoga, or listening to music. Journaling. Talking to a trusted person. Spending time with friends and family. Minimize exposure to UV radiation to reduce your risk of skin cancer. Safety Always wear your seat belt while driving or riding in a vehicle. Do not drive: If you have been drinking alcohol. Do not ride with someone who has been drinking. When you are tired or distracted. While texting. If you have been using any mind-altering substances or drugs. Wear a helmet and other protective equipment during sports activities. If you have firearms in your house, make sure you follow all gun safety procedures. Seek help if you have been physically or sexually abused. What's next? Visit your health care provider once a year for an annual wellness visit. Ask your health care provider how often you should have your eyes and teeth checked.  Stay up to date on all vaccines. This information is not intended to replace advice given to you by your health care provider. Make sure you discuss any questions you have with your health care provider. Document Revised: 11/28/2020 Document Reviewed: 11/28/2020 Elsevier Patient Education  2024 [employer].

## 2024-04-06 LAB — COLOGUARD: COLOGUARD: NEGATIVE

## 2024-04-07 ENCOUNTER — Ambulatory Visit: Payer: Self-pay | Admitting: Family Medicine

## 2024-04-07 NOTE — Progress Notes (Signed)
Great news! Cologuard is negative. We can repeat in 3 years.

## 2024-05-11 ENCOUNTER — Other Ambulatory Visit (HOSPITAL_COMMUNITY)
Admission: RE | Admit: 2024-05-11 | Discharge: 2024-05-11 | Disposition: A | Source: Ambulatory Visit | Attending: Obstetrics and Gynecology | Admitting: Obstetrics and Gynecology

## 2024-05-11 DIAGNOSIS — Z01419 Encounter for gynecological examination (general) (routine) without abnormal findings: Secondary | ICD-10-CM | POA: Insufficient documentation

## 2024-05-16 LAB — CYTOLOGY - PAP
Comment: NEGATIVE
Diagnosis: NEGATIVE
High risk HPV: NEGATIVE

## 2024-05-30 ENCOUNTER — Ambulatory Visit: Admitting: Family Medicine

## 2024-05-30 ENCOUNTER — Ambulatory Visit: Payer: Self-pay

## 2024-05-30 ENCOUNTER — Encounter: Payer: Self-pay | Admitting: Family Medicine

## 2024-05-30 VITALS — BP 110/70 | HR 96 | Temp 98.1°F | Ht 67.0 in | Wt 135.8 lb

## 2024-05-30 DIAGNOSIS — R197 Diarrhea, unspecified: Secondary | ICD-10-CM

## 2024-05-30 LAB — COMPREHENSIVE METABOLIC PANEL WITH GFR
ALT: 16 U/L (ref 0–35)
AST: 19 U/L (ref 0–37)
Albumin: 4.8 g/dL (ref 3.5–5.2)
Alkaline Phosphatase: 59 U/L (ref 39–117)
BUN: 12 mg/dL (ref 6–23)
CO2: 29 meq/L (ref 19–32)
Calcium: 9.7 mg/dL (ref 8.4–10.5)
Chloride: 100 meq/L (ref 96–112)
Creatinine, Ser: 0.64 mg/dL (ref 0.40–1.20)
GFR: 105.54 mL/min (ref >60.00)
Glucose, Bld: 85 mg/dL (ref 70–99)
Potassium: 4.1 meq/L (ref 3.5–5.1)
Sodium: 139 meq/L (ref 135–145)
Total Bilirubin: 0.8 mg/dL (ref 0.2–1.2)
Total Protein: 7.4 g/dL (ref 6.0–8.3)

## 2024-05-30 LAB — CBC
HCT: 38.7 % (ref 36.0–46.0)
Hemoglobin: 13.1 g/dL (ref 12.0–15.0)
MCHC: 33.7 g/dL (ref 30.0–36.0)
MCV: 93.8 fl (ref 78.0–100.0)
Platelets: 237 K/uL (ref 150.0–400.0)
RBC: 4.13 Mil/uL (ref 3.87–5.11)
RDW: 13.1 % (ref 11.5–15.5)
WBC: 7 K/uL (ref 4.0–10.5)

## 2024-05-30 LAB — LIPASE: Lipase: 54 U/L (ref 11.0–59.0)

## 2024-05-30 LAB — TSH: TSH: 2.21 u[IU]/mL (ref 0.35–5.50)

## 2024-05-30 NOTE — Patient Instructions (Signed)
 It was very nice to see you today!  VISIT SUMMARY: You came in today because you have been experiencing persistent diarrhea for the past six weeks. We discussed potential causes and next steps for diagnosis and treatment.  YOUR PLAN: CHRONIC DIARRHEA: You have been having diarrhea for six weeks, which may be due to a bacterial infection. -We will test a stool sample to check for bacterial infection. -We will also do blood work to check your pancreatic, gallbladder, liver, and thyroid  function. -You have been given a stool sample kit to use at home if you cannot provide a sample in the office.  Return if symptoms worsen or fail to improve.   Take care, Dr Kennyth  PLEASE NOTE:  If you had any lab tests, please let us  know if you have not heard back within a few days. You may see your results on mychart before we have a chance to review them but we will give you a call once they are reviewed by us .   If we ordered any referrals today, please let us  know if you have not heard from their office within the next week.   If you had any urgent prescriptions sent in today, please check with the pharmacy within an hour of our visit to make sure the prescription was transmitted appropriately.   Please try these tips to maintain a healthy lifestyle:  Eat at least 3 REAL meals and 1-2 snacks per day.  Aim for no more than 5 hours between eating.  If you eat breakfast, please do so within one hour of getting up.   Each meal should contain half fruits/vegetables, one quarter protein, and one quarter carbs (no bigger than a computer mouse)  Cut down on sweet beverages. This includes juice, soda, and sweet tea.   Drink at least 1 glass of water with each meal and aim for at least 8 glasses per day  Exercise at least 150 minutes every week.

## 2024-05-30 NOTE — Telephone Encounter (Signed)
 FYI Only or Action Required?: FYI only for provider: today.  Patient was last seen in primary care on 03/16/2024 by Kennyth Worth HERO, MD.  Called Nurse Triage reporting Rectal Bleeding.  Symptoms began several weeks ago.  Interventions attempted: Rest, hydration, or home remedies.  Symptoms are: unchanged.  Triage Disposition: See Physician Within 24 Hours  Patient/caregiver understands and will follow disposition?: Yes     wrote: Red Word that prompted transfer to Nurse Triage:  digestion symptoms for chronic diarrhea, bloated/ gas- did have blood in stool/ have been happening for a month or over a month in a half and haven't went away Reason for Disposition  MODERATE rectal bleeding (e.g., small blood clots, passing blood without stool, or toilet water turns red)    Unsure amount, was also on menses  Answer Assessment - Initial Assessment Questions 1.5 months ago -sushi-developed symptoms of bloating, gassy, bowel urgency.     1. APPEARANCE of BLOOD: What color is it? Is it passed separately, on the surface of the stool, or mixed in with the stool?       2. AMOUNT: How much blood was passed?      Varies-was on menses 3. FREQUENCY: How many times has blood been passed with the stools?      Up to 7 times per day 4. ONSET: When was the blood first seen in the stools? (Days or weeks)      1-1.5 months 5. DIARRHEA: Is there also some diarrhea? If Yes, ask: How many diarrhea stools in the past 24 hours?      yes 6. CONSTIPATION: Do you have constipation? If Yes, ask: How bad is it?     denies 7. RECURRENT SYMPTOMS: Have you had blood in your stools before? If Yes, ask: When was the last time? and What happened that time?      1-1.5 8. BLOOD THINNERS: Do you take any blood thinners? (e.g., aspirin, clopidogrel / Plavix, coumadin, heparin). Notes: Other strong blood thinners include: Arixtra (fondaparinux), Eliquis (apixaban), Pradaxa (dabigatran), and  Xarelto (rivaroxaban).      9. OTHER SYMPTOMS: Do you have any other symptoms?  (e.g., abdomen pain, vomiting, dizziness, fever)     Abdominal bloating 10. PREGNANCY: Is there any chance you are pregnant? When was your last menstrual period?  Protocols used: Rectal Bleeding-A-AH

## 2024-05-30 NOTE — Progress Notes (Signed)
° °  Christine Lawrence is a 48 y.o. female who presents today for an office visit.  Assessment/Plan:  Diarrhea  Patient with 6 weeks of diarrhea.  Overall reassuring exam today however given length of symptoms would be reasonable for us  to further evaluate at this point.  Will check labs today including CBC, c-Met, TSH, and lipase.  Will also check GI pathogen panel.  Depending on results of this may need referral to GI.      Subjective:  HPI:  See assessment / plan for status of chronic conditions.   Discussed the use of AI scribe software for clinical note transcription with the patient, who gave verbal consent to proceed.  History of Present Illness Christine Lawrence is a 48 year old female who presents with persistent diarrhea for six weeks.  She has been experiencing diarrhea approximately five to seven times a day for the past six weeks. The diarrhea is watery and sometimes 'shooting out'.  About a month and a half ago, she ate sushi, which she consumes regularly, and subsequently began experiencing these symptoms. Initially, she thought it was a typical reaction that would resolve on its own, but the symptoms persisted.  Two weeks ago, she noticed blood when wiping, but has not observed any since then. No fever, chills, abdominal pain, nausea, or vomiting. She reports bloating and a 'weird feeling' in her abdomen, uncertain if it is air or something else.  She has not tried any treatments for the diarrhea. No one else around her, including her husband who recently had the flu, has experienced similar symptoms.         Objective:  Physical Exam: Temp 98.1 F (36.7 C) (Temporal)   Ht 5' 7 (1.702 m)   Wt 135 lb 12.8 oz (61.6 kg)   BMI 21.27 kg/m   Gen: No acute distress, resting comfortably CV: Regular rate and rhythm with no murmurs appreciated Pulm: Normal work of breathing, clear to auscultation bilaterally with no crackles, wheezes, or rhonchi Abdomen: Bowel sounds present,  soft, nontender, nondistended Neuro: Grossly normal, moves all extremities Psych: Normal affect and thought content      Christine Ramaker M. Kennyth, MD 05/30/2024 1:26 PM

## 2024-05-31 ENCOUNTER — Other Ambulatory Visit

## 2024-05-31 DIAGNOSIS — R197 Diarrhea, unspecified: Secondary | ICD-10-CM | POA: Diagnosis not present

## 2024-06-01 LAB — C. DIFFICILE GDH AND TOXIN A/B
GDH ANTIGEN: NOT DETECTED
MICRO NUMBER:: 17362134
SPECIMEN QUALITY:: ADEQUATE
TOXIN A AND B: NOT DETECTED

## 2024-06-03 ENCOUNTER — Other Ambulatory Visit: Payer: Self-pay | Admitting: Family Medicine

## 2024-06-03 LAB — GASTROINTESTINAL PATHOGEN PNL
CampyloBacter Group: NOT DETECTED
Norovirus GI/GII: NOT DETECTED
Rotavirus A: NOT DETECTED
Salmonella species: NOT DETECTED
Shiga Toxin 1: NOT DETECTED
Shiga Toxin 2: NOT DETECTED
Shigella Species: NOT DETECTED
Vibrio Group: NOT DETECTED
Yersinia enterocolitica: NOT DETECTED

## 2024-06-06 ENCOUNTER — Ambulatory Visit: Payer: Self-pay | Admitting: Family Medicine

## 2024-06-06 DIAGNOSIS — R197 Diarrhea, unspecified: Secondary | ICD-10-CM

## 2024-06-06 DIAGNOSIS — K921 Melena: Secondary | ICD-10-CM

## 2024-06-06 NOTE — Progress Notes (Signed)
 All of her labs are normal. No clear cause for her GI symptoms. If she is still having frequent lose stools then recommend we refer her to see gastroenterology.

## 2024-06-17 ENCOUNTER — Encounter: Payer: Self-pay | Admitting: Physician Assistant

## 2024-07-12 ENCOUNTER — Other Ambulatory Visit: Payer: Self-pay | Admitting: Obstetrics and Gynecology

## 2024-07-12 DIAGNOSIS — Z1231 Encounter for screening mammogram for malignant neoplasm of breast: Secondary | ICD-10-CM

## 2024-07-18 ENCOUNTER — Ambulatory Visit: Admitting: Physician Assistant

## 2024-07-19 ENCOUNTER — Ambulatory Visit: Admitting: Physician Assistant

## 2024-07-19 ENCOUNTER — Encounter: Payer: Self-pay | Admitting: Physician Assistant

## 2024-07-19 ENCOUNTER — Ambulatory Visit

## 2024-07-19 VITALS — BP 90/60 | HR 72 | Ht 66.25 in | Wt 131.1 lb

## 2024-07-19 DIAGNOSIS — K625 Hemorrhage of anus and rectum: Secondary | ICD-10-CM

## 2024-07-19 DIAGNOSIS — R197 Diarrhea, unspecified: Secondary | ICD-10-CM | POA: Diagnosis not present

## 2024-07-19 MED ORDER — NA SULFATE-K SULFATE-MG SULF 17.5-3.13-1.6 GM/177ML PO SOLN: 1.0000 | ORAL | 0 refills | Status: AC

## 2024-07-19 NOTE — Progress Notes (Signed)
 Agree with the assessment and plan as outlined by Brigitte Canard, PA-C.

## 2024-07-19 NOTE — Patient Instructions (Signed)
 We have sent the following medications to your pharmacy for you to pick up at your convenience: Suprep   You have been scheduled for a colonoscopy. Please follow written instructions given to you at your visit today.   If you use inhalers (even only as needed), please bring them with you on the day of your procedure.  DO NOT TAKE 7 DAYS PRIOR TO TEST- Trulicity (dulaglutide) Ozempic, Wegovy (semaglutide) Mounjaro, Zepbound (tirzepatide) Bydureon Bcise (exanatide extended release)  DO NOT TAKE 1 DAY PRIOR TO YOUR TEST Rybelsus (semaglutide) Adlyxin (lixisenatide) Victoza (liraglutide) Byetta (exanatide) ___________________________________________________________________________   Due to recent changes in healthcare laws, you may see the results of your imaging and laboratory studies on MyChart before your provider has had a chance to review them.  We understand that in some cases there may be results that are confusing or concerning to you. Not all laboratory results come back in the same time frame and the provider may be waiting for multiple results in order to interpret others.  Please give us  48 hours in order for your provider to thoroughly review all the results before contacting the office for clarification of your results.   _______________________________________________________  If your blood pressure at your visit was 140/90 or greater, please contact your primary care physician to follow up on this.  _______________________________________________________  If you are age 48 or older, your body mass index should be between 23-30. Your Body mass index is 21 kg/m. If this is out of the aforementioned range listed, please consider follow up with your Primary Care Provider.  If you are age 48 or younger, your body mass index should be between 19-25. Your Body mass index is 21 kg/m. If this is out of the aformentioned range listed, please consider follow up with your Primary Care  Provider.   ________________________________________________________  The [facility] providers would like to encourage you to use MYCHART to communicate with providers for non-urgent requests or questions.  Due to long hold times on the telephone, sending your provider a message by [facility] may be a faster and more efficient way to get a response.  Please allow 48 business hours for a response.  Please remember that this is for non-urgent requests.  _______________________________________________________  [facility] is using a team-based approach to care.  Your team is made up of your doctor and two to three APPS. Our APPS (Nurse Practitioners and Physician Assistants) work with your physician to ensure care continuity for you. They are fully qualified to address your health concerns and develop a treatment plan. They communicate directly with your gastroenterologist to care for you. Seeing the Advanced Practice Practitioners on your physician's team can help you by facilitating care more promptly, often allowing for earlier appointments, access to diagnostic testing, procedures, and other specialty referrals.   Thank you for choosing me and [facility].  Ellouise Console, PA-C

## 2024-07-20 ENCOUNTER — Encounter: Payer: Self-pay | Admitting: Gastroenterology

## 2024-07-25 ENCOUNTER — Ambulatory Visit

## 2024-07-27 ENCOUNTER — Encounter: Admitting: Gastroenterology

## 2025-03-24 ENCOUNTER — Encounter: Admitting: Family Medicine
